# Patient Record
Sex: Female | Born: 1965 | Race: White | Hispanic: No | Marital: Married | Smoking: Former smoker
Health system: Southern US, Community
[De-identification: ages and names within clinical notes are randomized; demographics above are authoritative.]

## PROBLEM LIST (undated history)

## (undated) DIAGNOSIS — C7A090 Malignant carcinoid tumor of the bronchus and lung: Secondary | ICD-10-CM

## (undated) DIAGNOSIS — J189 Pneumonia, unspecified organism: Secondary | ICD-10-CM

## (undated) DIAGNOSIS — IMO0001 Reserved for inherently not codable concepts without codable children: Secondary | ICD-10-CM

## (undated) DIAGNOSIS — R059 Cough, unspecified: Secondary | ICD-10-CM

## (undated) DIAGNOSIS — Z86010 Personal history of colonic polyps: Secondary | ICD-10-CM

## (undated) DIAGNOSIS — D649 Anemia, unspecified: Secondary | ICD-10-CM

## (undated) DIAGNOSIS — Z87442 Personal history of urinary calculi: Secondary | ICD-10-CM

## (undated) DIAGNOSIS — R911 Solitary pulmonary nodule: Secondary | ICD-10-CM

## (undated) DIAGNOSIS — K219 Gastro-esophageal reflux disease without esophagitis: Secondary | ICD-10-CM

## (undated) DIAGNOSIS — J45909 Unspecified asthma, uncomplicated: Secondary | ICD-10-CM

## (undated) DIAGNOSIS — J309 Allergic rhinitis, unspecified: Secondary | ICD-10-CM

## (undated) DIAGNOSIS — R05 Cough: Secondary | ICD-10-CM

## (undated) DIAGNOSIS — J449 Chronic obstructive pulmonary disease, unspecified: Secondary | ICD-10-CM

## (undated) DIAGNOSIS — M1990 Unspecified osteoarthritis, unspecified site: Secondary | ICD-10-CM

## (undated) HISTORY — DX: Reserved for inherently not codable concepts without codable children: IMO0001

## (undated) HISTORY — DX: Unspecified osteoarthritis, unspecified site: M19.90

## (undated) HISTORY — DX: Chronic obstructive pulmonary disease, unspecified: J44.9

## (undated) HISTORY — PX: TUBAL LIGATION: SHX77

## (undated) HISTORY — DX: Gastro-esophageal reflux disease without esophagitis: K21.9

## (undated) HISTORY — DX: Cough, unspecified: R05.9

## (undated) HISTORY — DX: Unspecified asthma, uncomplicated: J45.909

## (undated) HISTORY — DX: Allergic rhinitis, unspecified: J30.9

## (undated) HISTORY — DX: Solitary pulmonary nodule: R91.1

## (undated) HISTORY — DX: Cough: R05

## (undated) HISTORY — PX: LITHOTRIPSY: SUR834

---

## 2013-02-20 ENCOUNTER — Encounter: Payer: Self-pay | Admitting: Critical Care Medicine

## 2013-02-21 ENCOUNTER — Ambulatory Visit (INDEPENDENT_AMBULATORY_CARE_PROVIDER_SITE_OTHER): Payer: BC Managed Care – PPO | Admitting: Critical Care Medicine

## 2013-02-21 ENCOUNTER — Encounter: Payer: Self-pay | Admitting: Critical Care Medicine

## 2013-02-21 VITALS — BP 124/84 | HR 67 | Temp 98.5°F | Ht 69.0 in | Wt 263.0 lb

## 2013-02-21 DIAGNOSIS — R911 Solitary pulmonary nodule: Secondary | ICD-10-CM

## 2013-02-21 DIAGNOSIS — R05 Cough: Secondary | ICD-10-CM

## 2013-02-21 DIAGNOSIS — J45991 Cough variant asthma: Secondary | ICD-10-CM | POA: Insufficient documentation

## 2013-02-21 DIAGNOSIS — R059 Cough, unspecified: Secondary | ICD-10-CM

## 2013-02-21 DIAGNOSIS — J45909 Unspecified asthma, uncomplicated: Secondary | ICD-10-CM

## 2013-02-21 MED ORDER — BENZONATATE 100 MG PO CAPS: ORAL_CAPSULE | ORAL | Status: DC

## 2013-02-21 MED ORDER — OMEPRAZOLE 20 MG PO CPDR: 20.0000 mg | DELAYED_RELEASE_CAPSULE | Freq: Every day | ORAL | Status: DC

## 2013-02-21 MED ORDER — HYDROCODONE-HOMATROPINE 5-1.5 MG PO TABS: 1.0000 | ORAL_TABLET | ORAL | Status: DC | PRN

## 2013-02-21 MED ORDER — CHLORPHENIRAMINE TANNATE 12 MG PO TABS: 1.0000 | ORAL_TABLET | Freq: Every day | ORAL | Status: DC

## 2013-02-21 NOTE — Progress Notes (Signed)
Subjective:    Patient ID: Anna Larson, female    DOB: 08/31/1966, 47 y.o.   MRN: [id number]  HPI Comments: Referral for lung mass and cough Chronic cough since 12/13.    Cough This is a new problem. The current episode started more than 1 month ago. The problem has been unchanged. The problem occurs hourly. The cough is productive of sputum (white sputum no blood). Associated symptoms include chest pain, headaches, heartburn, postnasal drip and wheezing. Pertinent negatives include no chills, ear congestion, ear pain, fever, hemoptysis, myalgias, nasal congestion, rash, rhinorrhea, sore throat, shortness of breath, sweats or weight loss. Associated symptoms comments: Chest pain is sharp pain at top of chest into the back if coughs, no change with deep breath Headaches with cough, bifrontal No dyspnea, wheezes come and go. The symptoms are aggravated by lying down and exercise (warm air causes cough, ok in cool air). Risk factors for lung disease include smoking/tobacco exposure. She has tried a beta-agonist inhaler, steroid inhaler and prescription cough suppressant (cough meds helped.  advair helps) for the symptoms. The treatment provided moderate relief. There is no history of asthma, bronchiectasis, COPD, emphysema, environmental allergies or pneumonia.   Past Medical History  Diagnosis Date  . Cough   . Lung mass   . Reactive airway disease      Family History  Problem Relation Age of Onset  . Bone cancer Maternal Grandmother   . Multiple sclerosis Mother   . Prostate cancer Paternal Grandfather      History   Social History  . Marital Status: Married    Spouse Name: N/A    Number of Children: N/A  . Years of Education: N/A   Occupational History  . [employer]    Social History Main Topics  . Smoking status: Never Smoker   . Smokeless tobacco: Never Used  . Alcohol Use: No  . Drug Use: Not on file  . Sexually Active: Not on file   Other Topics Concern  . Not on file    Social History Narrative  . No narrative on file     Allergies  Allergen Reactions  . Codeine      Outpatient Prescriptions Prior to Visit  Medication Sig Dispense Refill  . albuterol (PROVENTIL HFA;VENTOLIN HFA) 108 (90 BASE) MCG/ACT inhaler Inhale 2 puffs into the lungs every 6 (six) hours as needed for wheezing.      . Fluticasone-Salmeterol (ADVAIR) 250-50 MCG/DOSE AEPB Inhale 1 puff into the lungs every 12 (twelve) hours.      Marland Kitchen HYDROcodone-homatropine (HYCODAN) 5-1.5 MG/5ML syrup Take 5 mLs by mouth every 4 (four) hours as needed for cough.       No facility-administered medications prior to visit.       Review of Systems  Constitutional: Positive for diaphoresis. Negative for fever, chills, weight loss, activity change, appetite change, fatigue and unexpected weight change.  HENT: Positive for congestion and postnasal drip. Negative for hearing loss, ear pain, nosebleeds, sore throat, facial swelling, rhinorrhea, sneezing, mouth sores, trouble swallowing, neck pain, neck stiffness, dental problem, voice change, sinus pressure, tinnitus and ear discharge.   Eyes: Negative for photophobia, discharge, itching and visual disturbance.  Respiratory: Positive for cough, chest tightness and wheezing. Negative for apnea, hemoptysis, choking, shortness of breath and stridor.   Cardiovascular: Positive for chest pain and palpitations. Negative for leg swelling.  Gastrointestinal: Positive for heartburn. Negative for nausea, vomiting, abdominal pain, constipation, blood in stool and abdominal distention.  Genitourinary: Negative for  dysuria, urgency, frequency, hematuria, flank pain, decreased urine volume and difficulty urinating.  Musculoskeletal: Positive for back pain. Negative for myalgias, joint swelling, arthralgias and gait problem.  Skin: Negative for color change, pallor and rash.  Allergic/Immunologic: Negative for environmental allergies.  Neurological: Positive for  headaches. Negative for dizziness, tremors, seizures, syncope, speech difficulty, weakness, light-headedness and numbness.  Hematological: Negative for adenopathy. Does not bruise/bleed easily.  Psychiatric/Behavioral: Negative for confusion, sleep disturbance and agitation. The patient is not nervous/anxious.        Objective:   Physical Exam Filed Vitals:   02/21/13 1215  BP: 124/84  Pulse: 67  Temp: 98.5 F (36.9 C)  TempSrc: Oral  Height: 5\' 9"  (1.753 m)  Weight: 263 lb (119.296 kg)  SpO2: 98%    Gen: Pleasant, obese, in no distress,  normal affect  ENT: No lesions,  mouth clear,  oropharynx clear, ++postnasal drip  Neck: No JVD, no TMG, no carotid bruits  Lungs: No use of accessory muscles, no dullness to percussion, clear without rales or rhonchi  Cardiovascular: RRR, heart sounds normal, no murmur or gallops, no peripheral edema  Abdomen: soft and NT, no HSM,  BS normal  Musculoskeletal: No deformities, no cyanosis or clubbing  Neuro: alert, non focal  Skin: Warm, no lesions or rashes  No results found.        Assessment & Plan:

## 2013-02-21 NOTE — Patient Instructions (Addendum)
Follow cough protocol with hydrocodone and benzonatate Follow reflux diet Omeprazole one daily Stop advair Use albuterol only as needed We will follow up with another CT of chest in 3 months Return 6 weeks for cough follow up

## 2013-02-22 NOTE — Assessment & Plan Note (Signed)
RUL lobulated 1CM nodule, likely benign CT 02/2013 Plan F/u closely with repeat CT 7/ 2014

## 2013-02-22 NOTE — Assessment & Plan Note (Addendum)
Cyclical cough d/t upper airway instability and GERD. Doubt true asthma  Plan Follow cough protocol with hydrocodone and benzonatate Follow reflux diet Omeprazole one daily Stop advair Use albuterol only as needed We will follow up with another CT of chest in 3 months Return 6 weeks for cough follow up

## 2013-02-26 ENCOUNTER — Telehealth: Payer: Self-pay | Admitting: Critical Care Medicine

## 2013-02-26 NOTE — Telephone Encounter (Signed)
Dr Delford Field they no longer make the chlorpheniramine tennate 12 mg Do you want to have her to take the otc dose? Please advise thanks

## 2013-02-27 MED ORDER — CHLORPHENIRAMINE MALEATE 4 MG PO TABS: 12.0000 mg | ORAL_TABLET | Freq: Every day | ORAL | Status: DC

## 2013-02-27 NOTE — Telephone Encounter (Signed)
Pt has decidede that she would like to take 3 of the 4mg  tablet. This has been sent to her pharmacy.

## 2013-02-27 NOTE — Telephone Encounter (Signed)
Can try [place] or just get 4mg  and take 3

## 2013-04-04 ENCOUNTER — Ambulatory Visit (INDEPENDENT_AMBULATORY_CARE_PROVIDER_SITE_OTHER): Payer: BC Managed Care – PPO | Admitting: Critical Care Medicine

## 2013-04-04 ENCOUNTER — Encounter: Payer: Self-pay | Admitting: *Deleted

## 2013-04-04 ENCOUNTER — Encounter: Payer: Self-pay | Admitting: Critical Care Medicine

## 2013-04-04 VITALS — BP 122/84 | HR 86 | Temp 98.4°F | Ht 69.0 in | Wt 262.0 lb

## 2013-04-04 DIAGNOSIS — R05 Cough: Secondary | ICD-10-CM

## 2013-04-04 DIAGNOSIS — R059 Cough, unspecified: Secondary | ICD-10-CM

## 2013-04-04 MED ORDER — BENZONATATE 100 MG PO CAPS: ORAL_CAPSULE | ORAL | Status: DC

## 2013-04-04 MED ORDER — HYDROCODONE-HOMATROPINE 5-1.5 MG PO TABS: 1.0000 | ORAL_TABLET | ORAL | Status: DC | PRN

## 2013-04-04 NOTE — Assessment & Plan Note (Signed)
Cyclic cough without true asthma d/t GERD and postnasal drip Note spirometry normal 04/04/2013 Plan Cont cyclic cough protocol with hycodan pills and benzonatate Rx Gerd and antihistamine rx

## 2013-04-04 NOTE — Progress Notes (Signed)
Subjective:    Patient ID: Anna Larson, female    DOB: [birth year], 47 y.o.   MRN: [id number]  HPI 04/04/2013 At last ov we rx cyclical cough with  Follow cough protocol with hydrocodone and benzonatate  Follow reflux diet  Omeprazole one daily  Stop advair  Use albuterol only as needed  Cough did get better but now out side and cough more. No pndrip. Mucus is clear, Coughs all day long. If gets hot is worse.  Sore throat.   No heartburn, does burp . Foods :  Fish, steak, salad, baked potato.  No sodas. On PPI daily.  Benzonatate does not help.        Past Medical History  Diagnosis Date  . Cough   . Lung mass   . Reactive airway disease      Family History  Problem Relation Age of Onset  . Bone cancer Maternal Grandmother   . Multiple sclerosis Mother   . Prostate cancer Paternal Grandfather      History   Social History  . Marital Status: Married    Spouse Name: N/A    Number of Children: N/A  . Years of Education: N/A   Occupational History  . [employer]    Social History Main Topics  . Smoking status: Never Smoker   . Smokeless tobacco: Never Used  . Alcohol Use: No  . Drug Use: Not on file  . Sexually Active: Not on file   Other Topics Concern  . Not on file   Social History Narrative  . No narrative on file     Allergies  Allergen Reactions  . Codeine      Outpatient Prescriptions Prior to Visit  Medication Sig Dispense Refill  . albuterol (PROVENTIL HFA;VENTOLIN HFA) 108 (90 BASE) MCG/ACT inhaler Inhale 2 puffs into the lungs every 6 (six) hours as needed for wheezing.      . benzonatate (TESSALON) 100 MG capsule Use 1- 2 per cough protocol every 4 hours as needed  90 capsule  4  . chlorpheniramine (CHLOR-TRIMETON) 4 MG tablet Take 3 tablets (12 mg total) by mouth daily.  90 tablet  2  . Hydrocodone-Homatropine 5-1.5 MG TABS Take 1 tablet by mouth every 4 (four) hours as needed. Per cough protocol  60 each  1  . omeprazole (PRILOSEC) 20 MG  capsule Take 1 capsule (20 mg total) by mouth daily.  30 capsule  4  . Chlorpheniramine Tannate 12 MG TABS Take 1 tablet (12 mg total) by mouth at bedtime.  30 each  6   No facility-administered medications prior to visit.       Review of Systems  Constitutional: Positive for diaphoresis. Negative for activity change, appetite change, fatigue and unexpected weight change.  HENT: Positive for congestion. Negative for hearing loss, nosebleeds, facial swelling, sneezing, mouth sores, trouble swallowing, neck pain, neck stiffness, dental problem, voice change, sinus pressure, tinnitus and ear discharge.   Eyes: Negative for photophobia, discharge, itching and visual disturbance.  Respiratory: Positive for chest tightness. Negative for apnea, choking and stridor.   Cardiovascular: Positive for palpitations. Negative for leg swelling.  Gastrointestinal: Negative for nausea, vomiting, abdominal pain, constipation, blood in stool and abdominal distention.  Genitourinary: Negative for dysuria, urgency, frequency, hematuria, flank pain, decreased urine volume and difficulty urinating.  Musculoskeletal: Positive for back pain. Negative for joint swelling, arthralgias and gait problem.  Skin: Negative for color change and pallor.  Neurological: Negative for dizziness, tremors, seizures, syncope, speech difficulty,  weakness, light-headedness and numbness.  Hematological: Negative for adenopathy. Does not bruise/bleed easily.  Psychiatric/Behavioral: Negative for confusion, sleep disturbance and agitation. The patient is not nervous/anxious.        Objective:   Physical Exam  Filed Vitals:   04/04/13 0909  BP: 122/84  Pulse: 86  Temp: 98.4 F (36.9 C)  TempSrc: Oral  Height: 5\' 9"  (1.753 m)  Weight: 262 lb (118.842 kg)  SpO2: 96%    Gen: Pleasant, obese, in no distress,  normal affect  ENT: No lesions,  mouth clear,  oropharynx clear, ++postnasal drip  Neck: No JVD, no TMG, no carotid  bruits  Lungs: No use of accessory muscles, no dullness to percussion, clear without rales or rhonchi  Cardiovascular: RRR, heart sounds normal, no murmur or gallops, no peripheral edema  Abdomen: soft and NT, no HSM,  BS normal  Musculoskeletal: No deformities, no cyanosis or clubbing  Neuro: alert, non focal  Skin: Warm, no lesions or rashes  No results found.        Assessment & Plan:   Cough Cyclic cough without true asthma d/t GERD and postnasal drip Note spirometry normal 04/04/2013 Plan Cont cyclic cough protocol with hycodan pills and benzonatate Rx Genella Rife and antihistamine rx   Updated Medication List Outpatient Encounter Prescriptions as of 04/04/2013  Medication Sig Dispense Refill  . albuterol (PROVENTIL HFA;VENTOLIN HFA) 108 (90 BASE) MCG/ACT inhaler Inhale 2 puffs into the lungs every 6 (six) hours as needed for wheezing.      . benzonatate (TESSALON) 100 MG capsule Use 1- 2 per cough protocol every 4 hours as needed  90 capsule  4  . chlorpheniramine (CHLOR-TRIMETON) 4 MG tablet Take 3 tablets (12 mg total) by mouth daily.  90 tablet  2  . diclofenac (VOLTAREN) 75 MG EC tablet Take 1 tablet by mouth 2 (two) times daily.      . Hydrocodone-Homatropine 5-1.5 MG TABS Take 1 tablet by mouth every 4 (four) hours as needed. Per cough protocol  60 each  1  . omeprazole (PRILOSEC) 20 MG capsule Take 1 capsule (20 mg total) by mouth daily.  30 capsule  4  . [DISCONTINUED] benzonatate (TESSALON) 100 MG capsule Use 1- 2 per cough protocol every 4 hours as needed  90 capsule  4  . [DISCONTINUED] Hydrocodone-Homatropine 5-1.5 MG TABS Take 1 tablet by mouth every 4 (four) hours as needed. Per cough protocol  60 each  1  . [DISCONTINUED] Chlorpheniramine Tannate 12 MG TABS Take 1 tablet (12 mg total) by mouth at bedtime.  30 each  6   No facility-administered encounter medications on file as of 04/04/2013.

## 2013-04-04 NOTE — Patient Instructions (Addendum)
REpeat cyclic cough protocol again for 3 days using hydrocodone/ benzonatate No other medication changes Return 3 months

## 2013-05-23 ENCOUNTER — Ambulatory Visit (HOSPITAL_BASED_OUTPATIENT_CLINIC_OR_DEPARTMENT_OTHER)
Admission: RE | Admit: 2013-05-23 | Discharge: 2013-05-23 | Disposition: A | Discharge status: Home / Self Care | Payer: BC Managed Care – PPO | Source: Ambulatory Visit | Attending: Critical Care Medicine | Admitting: Critical Care Medicine

## 2013-05-23 DIAGNOSIS — J45909 Unspecified asthma, uncomplicated: Secondary | ICD-10-CM

## 2013-05-23 DIAGNOSIS — R911 Solitary pulmonary nodule: Secondary | ICD-10-CM | POA: Insufficient documentation

## 2013-05-27 ENCOUNTER — Telehealth: Payer: Self-pay | Admitting: Critical Care Medicine

## 2013-05-27 NOTE — Telephone Encounter (Signed)
Calling back again about the same.

## 2013-05-27 NOTE — Progress Notes (Signed)
Quick Note:  [email or web address] for pt ______ 

## 2013-05-27 NOTE — Telephone Encounter (Signed)
Notes Recorded by [name], MD on 05/24/2013 at 1:19 PM Call pt and tell her RUL nodule is stable. A repeat CT should be done in 6 months  -----  [email or web address]

## 2013-05-28 ENCOUNTER — Telehealth: Payer: Self-pay | Admitting: Critical Care Medicine

## 2013-05-28 NOTE — Telephone Encounter (Signed)
Patient calling again about results.  [phone]

## 2013-05-28 NOTE — Telephone Encounter (Signed)
Notes Recorded by [name], MD on 05/24/2013 at 1:19 PM Call pt and tell her RUL nodule is stable. A repeat CT should be done in 6 months  Pt aware of results.

## 2013-05-28 NOTE — Telephone Encounter (Signed)
atc

## 2013-05-28 NOTE — Telephone Encounter (Signed)
LMTCB

## 2013-05-29 NOTE — Progress Notes (Signed)
Quick Note:  Per phone msg from 05/28/13, pt aware of results. PW has a reminder if for repeat CT in 6 months. ______

## 2013-07-04 ENCOUNTER — Ambulatory Visit: Payer: BC Managed Care – PPO | Admitting: Critical Care Medicine

## 2013-07-11 ENCOUNTER — Ambulatory Visit (INDEPENDENT_AMBULATORY_CARE_PROVIDER_SITE_OTHER): Payer: BC Managed Care – PPO | Admitting: Critical Care Medicine

## 2013-07-11 ENCOUNTER — Encounter: Payer: Self-pay | Admitting: Critical Care Medicine

## 2013-07-11 VITALS — BP 122/72 | HR 90 | Ht 69.0 in | Wt 268.0 lb

## 2013-07-11 DIAGNOSIS — R911 Solitary pulmonary nodule: Secondary | ICD-10-CM

## 2013-07-11 DIAGNOSIS — R05 Cough: Secondary | ICD-10-CM

## 2013-07-11 DIAGNOSIS — R059 Cough, unspecified: Secondary | ICD-10-CM

## 2013-07-11 MED ORDER — HYDROCODONE-HOMATROPINE 5-1.5 MG PO TABS: 1.0000 | ORAL_TABLET | ORAL | Status: DC | PRN

## 2013-07-11 NOTE — Assessment & Plan Note (Signed)
Cyclical cough without true asthma due to reflux disease and postnasal drip well controlled at this time Plan Maintain chlorpheniramine and reflux medication Hycodan cough pills were refilled Return as needed

## 2013-07-11 NOTE — Progress Notes (Signed)
Subjective:    Patient ID: Anna Larson, female    DOB: 04/12/1966, 47 y.o.   MRN: [id number]  HPI  07/11/2013 Chief Complaint  Patient presents with  . 3 month follow up    Cough has improved but does still cough when in the heat.  Cough is prod at time with clear mucus.  Sore throat today with PND.     Since the last visit 1 year ago the patient's cough has nearly resolved. The patient maintains chlorpheniramine and omeprazole. She intermittently is following the reflux diet. There are no other new complaints  Past Medical History  Diagnosis Date  . Cough   . Lung mass   . Reactive airway disease      Family History  Problem Relation Age of Onset  . Bone cancer Maternal Grandmother   . Multiple sclerosis Mother   . Prostate cancer Paternal Grandfather      History   Social History  . Marital Status: Married    Spouse Name: N/A    Number of Children: N/A  . Years of Education: N/A   Occupational History  . [employer]    Social History Main Topics  . Smoking status: Never Smoker   . Smokeless tobacco: Never Used  . Alcohol Use: No  . Drug Use: Not on file  . Sexual Activity: Not on file   Other Topics Concern  . Not on file   Social History Narrative  . No narrative on file     Allergies  Allergen Reactions  . Codeine      Outpatient Prescriptions Prior to Visit  Medication Sig Dispense Refill  . albuterol (PROVENTIL HFA;VENTOLIN HFA) 108 (90 BASE) MCG/ACT inhaler Inhale 2 puffs into the lungs every 6 (six) hours as needed for wheezing.      . chlorpheniramine (CHLOR-TRIMETON) 4 MG tablet Take 3 tablets (12 mg total) by mouth daily.  90 tablet  2  . diclofenac (VOLTAREN) 75 MG EC tablet Take 1 tablet by mouth 2 (two) times daily.      Marland Kitchen omeprazole (PRILOSEC) 20 MG capsule Take 1 capsule (20 mg total) by mouth daily.  30 capsule  4  . Hydrocodone-Homatropine 5-1.5 MG TABS Take 1 tablet by mouth every 4 (four) hours as needed. Per cough protocol  60 each  1  .  benzonatate (TESSALON) 100 MG capsule Use 1- 2 per cough protocol every 4 hours as needed  90 capsule  4   No facility-administered medications prior to visit.       Review of Systems  Constitutional: Positive for diaphoresis. Negative for activity change, appetite change, fatigue and unexpected weight change.  HENT: Positive for congestion. Negative for hearing loss, nosebleeds, facial swelling, sneezing, mouth sores, trouble swallowing, neck pain, neck stiffness, dental problem, voice change, sinus pressure, tinnitus and ear discharge.   Eyes: Negative for photophobia, discharge, itching and visual disturbance.  Respiratory: Positive for chest tightness. Negative for apnea, choking and stridor.   Cardiovascular: Positive for palpitations. Negative for leg swelling.  Gastrointestinal: Negative for nausea, vomiting, abdominal pain, constipation, blood in stool and abdominal distention.  Genitourinary: Negative for dysuria, urgency, frequency, hematuria, flank pain, decreased urine volume and difficulty urinating.  Musculoskeletal: Positive for back pain. Negative for joint swelling, arthralgias and gait problem.  Skin: Negative for color change and pallor.  Neurological: Negative for dizziness, tremors, seizures, syncope, speech difficulty, weakness, light-headedness and numbness.  Hematological: Negative for adenopathy. Does not bruise/bleed easily.  Psychiatric/Behavioral: Negative for  confusion, sleep disturbance and agitation. The patient is not nervous/anxious.        Objective:   Physical Exam  Filed Vitals:   07/11/13 1436  BP: 122/72  Pulse: 90  Height: 5\' 9"  (1.753 m)  Weight: 268 lb (121.564 kg)  SpO2: 98%    Gen: Pleasant, obese, in no distress,  normal affect  ENT: No lesions,  mouth clear,  oropharynx clear, ++postnasal drip  Neck: No JVD, no TMG, no carotid bruits  Lungs: No use of accessory muscles, no dullness to percussion, clear without rales or  rhonchi  Cardiovascular: RRR, heart sounds normal, no murmur or gallops, no peripheral edema  Abdomen: soft and NT, no HSM,  BS normal  Musculoskeletal: No deformities, no cyanosis or clubbing  Neuro: alert, non focal  Skin: Warm, no lesions or rashes  No results found.  CT chest 05/2013: unchanged RUL lung nodule, benign      Assessment & Plan:   Cough Cyclical cough without true asthma due to reflux disease and postnasal drip well controlled at this time Plan Maintain chlorpheniramine and reflux medication Hycodan cough pills were refilled Return as needed   RUL lung nodule, likely benign, no change over time    Updated Medication List Outpatient Encounter Prescriptions as of 07/11/2013  Medication Sig Dispense Refill  . albuterol (PROVENTIL HFA;VENTOLIN HFA) 108 (90 BASE) MCG/ACT inhaler Inhale 2 puffs into the lungs every 6 (six) hours as needed for wheezing.      . chlorpheniramine (CHLOR-TRIMETON) 4 MG tablet Take 3 tablets (12 mg total) by mouth daily.  90 tablet  2  . diclofenac (VOLTAREN) 75 MG EC tablet Take 1 tablet by mouth 2 (two) times daily.      . Hydrocodone-Homatropine 5-1.5 MG TABS Take 1 tablet by mouth every 4 (four) hours as needed. Per cough protocol  60 each  1  . omeprazole (PRILOSEC) 20 MG capsule Take 1 capsule (20 mg total) by mouth daily.  30 capsule  4  . [DISCONTINUED] Hydrocodone-Homatropine 5-1.5 MG TABS Take 1 tablet by mouth every 4 (four) hours as needed. Per cough protocol  60 each  1  . [DISCONTINUED] benzonatate (TESSALON) 100 MG capsule Use 1- 2 per cough protocol every 4 hours as needed  90 capsule  4   No facility-administered encounter medications on file as of 07/11/2013.

## 2013-07-11 NOTE — Patient Instructions (Addendum)
Hycodan cough pills reordered A Flu vaccine was given Stay on chlorpheniramine and omeprazole Stay on reflux diet Return to pulmonary as needed

## 2013-09-23 ENCOUNTER — Ambulatory Visit (INDEPENDENT_AMBULATORY_CARE_PROVIDER_SITE_OTHER): Payer: BC Managed Care – PPO | Admitting: Critical Care Medicine

## 2013-09-23 ENCOUNTER — Encounter: Payer: Self-pay | Admitting: Critical Care Medicine

## 2013-09-23 VITALS — BP 122/78 | HR 73 | Temp 98.3°F | Ht 69.0 in | Wt 265.0 lb

## 2013-09-23 DIAGNOSIS — J45991 Cough variant asthma: Secondary | ICD-10-CM

## 2013-09-23 DIAGNOSIS — R059 Cough, unspecified: Secondary | ICD-10-CM

## 2013-09-23 DIAGNOSIS — J309 Allergic rhinitis, unspecified: Secondary | ICD-10-CM

## 2013-09-23 DIAGNOSIS — R05 Cough: Secondary | ICD-10-CM

## 2013-09-23 DIAGNOSIS — Z23 Encounter for immunization: Secondary | ICD-10-CM

## 2013-09-23 MED ORDER — CICLESONIDE 80 MCG/ACT IN AERS: 2.0000 | INHALATION_SPRAY | Freq: Every day | RESPIRATORY_TRACT | Status: DC

## 2013-09-23 MED ORDER — BENZONATATE 100 MG PO CAPS: ORAL_CAPSULE | ORAL | Status: DC

## 2013-09-23 MED ORDER — OMEPRAZOLE 20 MG PO CPDR: 20.0000 mg | DELAYED_RELEASE_CAPSULE | Freq: Two times a day (BID) | ORAL | Status: DC

## 2013-09-23 NOTE — Patient Instructions (Addendum)
Try [place] two puff daily, use samples Start Benzonatate 1-2 every 4 hours as needed for cough, and minimize the use of the hycodan tablets Increase omeprazole one twice daily before meals Follow reflux diet Strict Allergy blood test today Pneumonia vaccine today was given Return 2 months

## 2013-09-23 NOTE — Assessment & Plan Note (Signed)
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

## 2013-09-23 NOTE — Progress Notes (Signed)
Subjective:    Patient ID: Anna Larson, female    DOB: May 31, 1966, 47 y.o.   MRN: [id number]  HPI Since the last visit 1 year ago the patient's cough has nearly resolved. The patient maintains chlorpheniramine and omeprazole. She intermittently is following the reflux diet. There are no other new complaints   09/23/2013 Chief Complaint  Patient presents with  . Follow-up    Still has cough - prod at times with clear mucus.  SOB when climbing stairs.  Also, has sharp right side chest pain that radiates through back with nausea - started over the weekend.    Still coughing.  Not as bad as before.  If gets hot, takes a cough pill before goes to work.  Engineer, petroleum.  Cough is occ prod of phlegm, clear.  Pt notes some dyspnea with exertion, if goes down steps and up steps is dyspneic. Occ wheeze is noted. No pndrip.  Notes anterior chest pain through the back.  Sharp pain, may be worse with deep breath.  No heavy lifting.  No heartburn. No belch or burping. No sore throat. Pt is hoarse in am.  Occ cough qhs if gets hot Uses cough pill on ave>>one a day. Past Medical History  Diagnosis Date  . Cough      Family History  Problem Relation Age of Onset  . Bone cancer Maternal Grandmother   . Multiple sclerosis Mother   . Prostate cancer Paternal Grandfather      History   Social History  . Marital Status: Married    Spouse Name: N/A    Number of Children: N/A  . Years of Education: N/A   Occupational History  . [employer]    Social History Main Topics  . Smoking status: Never Smoker   . Smokeless tobacco: Never Used  . Alcohol Use: No  . Drug Use: Not on file  . Sexual Activity: Not on file   Other Topics Concern  . Not on file   Social History Narrative  . No narrative on file     Allergies  Allergen Reactions  . Codeine      Outpatient Prescriptions Prior to Visit  Medication Sig Dispense Refill  . albuterol (PROVENTIL HFA;VENTOLIN HFA) 108 (90 BASE) MCG/ACT  inhaler Inhale 2 puffs into the lungs every 6 (six) hours as needed for wheezing.      . chlorpheniramine (CHLOR-TRIMETON) 4 MG tablet Take 3 tablets (12 mg total) by mouth daily.  90 tablet  2  . diclofenac (VOLTAREN) 75 MG EC tablet Take 1 tablet by mouth 2 (two) times daily as needed.       . Hydrocodone-Homatropine 5-1.5 MG TABS Take 1 tablet by mouth every 4 (four) hours as needed. Per cough protocol  60 each  1  . omeprazole (PRILOSEC) 20 MG capsule Take 1 capsule (20 mg total) by mouth daily.  30 capsule  4   No facility-administered medications prior to visit.       Review of Systems  Constitutional: Positive for diaphoresis. Negative for activity change, appetite change, fatigue and unexpected weight change.  HENT: Positive for congestion. Negative for dental problem, ear discharge, facial swelling, hearing loss, mouth sores, nosebleeds, sinus pressure, sneezing, tinnitus, trouble swallowing and voice change.   Eyes: Negative for photophobia, discharge, itching and visual disturbance.  Respiratory: Positive for chest tightness. Negative for apnea, choking and stridor.   Cardiovascular: Positive for palpitations. Negative for leg swelling.  Gastrointestinal: Negative for nausea, vomiting, abdominal pain,  constipation, blood in stool and abdominal distention.  Genitourinary: Negative for dysuria, urgency, frequency, hematuria, flank pain, decreased urine volume and difficulty urinating.  Musculoskeletal: Positive for back pain. Negative for arthralgias, gait problem, joint swelling, neck pain and neck stiffness.  Skin: Negative for color change and pallor.  Neurological: Negative for dizziness, tremors, seizures, syncope, speech difficulty, weakness, light-headedness and numbness.  Hematological: Negative for adenopathy. Does not bruise/bleed easily.  Psychiatric/Behavioral: Negative for confusion, sleep disturbance and agitation. The patient is not nervous/anxious.         Objective:   Physical Exam  Filed Vitals:   09/23/13 0946  BP: 122/78  Pulse: 73  Temp: 98.3 F (36.8 C)  TempSrc: Oral  Height: 5\' 9"  (1.753 m)  Weight: 265 lb (120.203 kg)  SpO2: 100%    Gen: Pleasant, obese, in no distress,  normal affect  ENT: No lesions,  mouth clear,  oropharynx clear, ++postnasal drip  Neck: No JVD, no TMG, no carotid bruits  Lungs: No use of accessory muscles, no dullness to percussion, clear without rales or rhonchi  Cardiovascular: RRR, heart sounds normal, no murmur or gallops, no peripheral edema  Abdomen: soft and NT, no HSM,  BS normal  Musculoskeletal: No deformities, no cyanosis or clubbing  Neuro: alert, non focal  Skin: Warm, no lesions or rashes  No results found.  Spirometry 09/23/2013 normal      Assessment & Plan:   Cough variant asthma Cyclical cough with associated cough variant asthma due to ongoing reflux disease and postnasal drip from allergic rhinitis Note previous spirometry showed restrictive defect only and this is confirmed on current study of 09/23/2013  Plan Try Alvesco two puff daily, use samples Start Benzonatate 1-2 every 4 hours as needed for cough, and minimize the use of the hycodan tablets Increase omeprazole one twice daily before meals Follow reflux diet Strict Allergy blood test today Pneumonia vaccine today was given Return 2 months    RUL lung nodule, likely benign, no change over time    Updated Medication List Outpatient Encounter Prescriptions as of 09/23/2013  Medication Sig  . albuterol (PROVENTIL HFA;VENTOLIN HFA) 108 (90 BASE) MCG/ACT inhaler Inhale 2 puffs into the lungs every 6 (six) hours as needed for wheezing.  . chlorpheniramine (CHLOR-TRIMETON) 4 MG tablet Take 3 tablets (12 mg total) by mouth daily.  . diclofenac (VOLTAREN) 75 MG EC tablet Take 1 tablet by mouth 2 (two) times daily as needed.   . Hydrocodone-Homatropine 5-1.5 MG TABS Take 1 tablet by mouth every 4 (four)  hours as needed. Per cough protocol  . omeprazole (PRILOSEC) 20 MG capsule Take 1 capsule (20 mg total) by mouth 2 (two) times daily before a meal.  . [DISCONTINUED] omeprazole (PRILOSEC) 20 MG capsule Take 1 capsule (20 mg total) by mouth daily.  . benzonatate (TESSALON) 100 MG capsule Take 1-2 every 4 hours as needed for cough  . ciclesonide (ALVESCO) 80 MCG/ACT inhaler Inhale 2 puffs into the lungs daily.

## 2013-09-24 LAB — ALLERGY FULL PROFILE
Allergen, D pternoyssinus,d7: <0.1 kU/L
Allergen,Goose feathers, e70: <0.1 kU/L
Aspergillus fumigatus, m3: <0.1 kU/L
Bermuda Grass: <0.1 kU/L
Box Elder IgE: <0.1 kU/L
Candida Albicans: <0.1 kU/L
Cat Dander: <0.1 kU/L
Curvularia lunata: <0.1 kU/L
G005 Rye, Perennial: <0.1 kU/L
G009 Red Top: <0.1 kU/L
Helminthosporium halodes: <0.1 kU/L
House Dust Hollister: <0.1 kU/L
IgE (Immunoglobulin E), Serum: 2.1 IU/mL (ref 0.0–180.0)
Oak: <0.1 kU/L
Plantain: <0.1 kU/L
Stemphylium Botryosum: <0.1 kU/L
Timothy Grass: <0.1 kU/L

## 2013-09-25 NOTE — Progress Notes (Signed)
Quick Note:  Called pt, went directly to VM. lmomtcb for pt ______

## 2013-09-25 NOTE — Progress Notes (Signed)
Quick Note:  Call pt and tell her labs are ok, No change in medications She has NO positive allergies seen ______

## 2013-09-25 NOTE — Progress Notes (Signed)
Quick Note:  Patient returned call. Advised of lab results / recs as stated by PW. Pt verbalized understanding and denied any questions. ______

## 2013-11-22 ENCOUNTER — Telehealth: Payer: Self-pay | Admitting: *Deleted

## 2013-11-22 DIAGNOSIS — R911 Solitary pulmonary nodule: Secondary | ICD-10-CM

## 2013-11-22 NOTE — Telephone Encounter (Signed)
Message copied by Adalberto Ill on Fri Nov 22, 2013  9:16 AM ------      Message from: Elsie Stain      Created: Fri Nov 22, 2013  8:58 AM       Need to schedule ct chest non contrast to f/u on lung nodule            ----- Message -----         From: Elsie Stain, MD         Sent: 05/23/2013  11:48 AM           To: Elsie Stain, MD            Recheck CT chest nodule       ------

## 2013-11-22 NOTE — Telephone Encounter (Signed)
Order placed - lmomtcb for pt to inform her

## 2013-11-22 NOTE — Telephone Encounter (Signed)
Returning call can be reached at [phone].Elnita Maxwell

## 2013-11-22 NOTE — Telephone Encounter (Signed)
lmomtcb x1 

## 2013-11-22 NOTE — Telephone Encounter (Signed)
Called, spoke with pt.  She is aware it is time to have CT Chest done.  She is aware PCCs will call regarding the appt date and time for this.  She verbalized understanding and is aware of her pending OV with PW on Jan 27.

## 2013-11-26 ENCOUNTER — Ambulatory Visit: Payer: BC Managed Care – PPO | Admitting: Critical Care Medicine

## 2013-11-27 ENCOUNTER — Inpatient Hospital Stay: Admission: RE | Admit: 2013-11-27 | Payer: BC Managed Care – PPO | Source: Ambulatory Visit

## 2013-12-02 ENCOUNTER — Ambulatory Visit (INDEPENDENT_AMBULATORY_CARE_PROVIDER_SITE_OTHER)
Admission: RE | Admit: 2013-12-02 | Discharge: 2013-12-02 | Disposition: A | Discharge status: Home / Self Care | Payer: 59 | Source: Ambulatory Visit | Attending: Critical Care Medicine | Admitting: Critical Care Medicine

## 2013-12-02 ENCOUNTER — Encounter: Payer: Self-pay | Admitting: Critical Care Medicine

## 2013-12-02 DIAGNOSIS — R911 Solitary pulmonary nodule: Secondary | ICD-10-CM

## 2013-12-03 ENCOUNTER — Ambulatory Visit (INDEPENDENT_AMBULATORY_CARE_PROVIDER_SITE_OTHER): Payer: 59 | Admitting: Critical Care Medicine

## 2013-12-03 ENCOUNTER — Encounter: Payer: Self-pay | Admitting: Critical Care Medicine

## 2013-12-03 VITALS — BP 128/80 | HR 87 | Temp 98.3°F | Ht 67.0 in | Wt 263.0 lb

## 2013-12-03 DIAGNOSIS — J45991 Cough variant asthma: Secondary | ICD-10-CM

## 2013-12-03 DIAGNOSIS — R911 Solitary pulmonary nodule: Secondary | ICD-10-CM

## 2013-12-03 MED ORDER — HYDROCODONE-HOMATROPINE 5-1.5 MG PO TABS
1.0000 | ORAL_TABLET | ORAL | Status: DC | PRN
Start: 2013-12-03 — End: 2015-08-26

## 2013-12-03 NOTE — Patient Instructions (Signed)
Use hycodan as needed Use pepcid 20mg  or zantac 150mg  at bedtime Over the counter Stay on omeprazole twice daily Stop Alvesco Return 4 months

## 2013-12-03 NOTE — Progress Notes (Signed)
Subjective:    Patient ID: Anna Larson, female    DOB: [birth year], 48 y.o.   MRN: [id number]  HPI Since the last visit 1 year ago the patient's cough has nearly resolved. The patient maintains chlorpheniramine and omeprazole. She intermittently is following the reflux diet. There are no other new complaints  12/03/2013 Chief Complaint  Patient presents with  . 2 month follow up    Coughing unchnaged - losses breath and has wheezing during coughing spells at times.  Cough is prod at times with clear mucus.   Cyclical cough with associated cough variant asthma due to ongoing reflux disease and postnasal drip from allergic rhinitis Note previous spirometry showed restrictive defect only and this is confirmed on current study of 09/23/2013  Plan Try Alvesco two puff daily, use samples Start Benzonatate 1-2 every 4 hours as needed for cough, and minimize the use of the hycodan tablets Increase omeprazole one twice daily before meals Follow reflux diet Strict Allergy blood test today  No change to cough , coughs till has emesis.  Nodule unchanged on CT  Cough occ during meals.  If gets hot will cough severly. No real wheeze.     Past Medical History  Diagnosis Date  . Cough      Family History  Problem Relation Age of Onset  . Bone cancer Maternal Grandmother   . Multiple sclerosis Mother   . Prostate cancer Paternal Grandfather      History   Social History  . Marital Status: Married    Spouse Name: N/A    Number of Children: N/A  . Years of Education: N/A   Occupational History  . [employer]    Social History Main Topics  . Smoking status: Never Smoker   . Smokeless tobacco: Never Used  . Alcohol Use: No  . Drug Use: Not on file  . Sexual Activity: Not on file   Other Topics Concern  . Not on file   Social History Narrative  . No narrative on file     Allergies  Allergen Reactions  . Codeine      Outpatient Prescriptions Prior to Visit  Medication Sig  Dispense Refill  . albuterol (PROVENTIL HFA;VENTOLIN HFA) 108 (90 BASE) MCG/ACT inhaler Inhale 2 puffs into the lungs every 6 (six) hours as needed for wheezing.      . benzonatate (TESSALON) 100 MG capsule Take 1-2 every 4 hours as needed for cough  90 capsule  4  . chlorpheniramine (CHLOR-TRIMETON) 4 MG tablet Take 3 tablets (12 mg total) by mouth daily.  90 tablet  2  . diclofenac (VOLTAREN) 75 MG EC tablet Take 1 tablet by mouth 2 (two) times daily as needed.       Marland Kitchen omeprazole (PRILOSEC) 20 MG capsule Take 1 capsule (20 mg total) by mouth 2 (two) times daily before a meal.  60 capsule  6  . ciclesonide (ALVESCO) 80 MCG/ACT inhaler Inhale 2 puffs into the lungs daily.  1 Inhaler  3  . Hydrocodone-Homatropine 5-1.5 MG TABS Take 1 tablet by mouth every 4 (four) hours as needed. Per cough protocol  60 each  1   No facility-administered medications prior to visit.       Review of Systems  Constitutional: Positive for diaphoresis. Negative for activity change, appetite change, fatigue and unexpected weight change.  HENT: Positive for congestion. Negative for dental problem, ear discharge, facial swelling, hearing loss, mouth sores, nosebleeds, sinus pressure, sneezing, tinnitus, trouble swallowing and voice  change.   Eyes: Negative for photophobia, discharge, itching and visual disturbance.  Respiratory: Positive for chest tightness. Negative for apnea, choking and stridor.   Cardiovascular: Positive for palpitations. Negative for leg swelling.  Gastrointestinal: Negative for nausea, vomiting, abdominal pain, constipation, blood in stool and abdominal distention.  Genitourinary: Negative for dysuria, urgency, frequency, hematuria, flank pain, decreased urine volume and difficulty urinating.  Musculoskeletal: Positive for back pain. Negative for arthralgias, gait problem, joint swelling, neck pain and neck stiffness.  Skin: Negative for color change and pallor.  Neurological: Negative for  dizziness, tremors, seizures, syncope, speech difficulty, weakness, light-headedness and numbness.  Hematological: Negative for adenopathy. Does not bruise/bleed easily.  Psychiatric/Behavioral: Negative for confusion, sleep disturbance and agitation. The patient is not nervous/anxious.        Objective:   Physical Exam  Filed Vitals:   12/03/13 1440  BP: 128/80  Pulse: 87  Temp: 98.3 F (36.8 C)  TempSrc: Oral  Height: 5\' 7"  (1.702 m)  Weight: 263 lb (119.296 kg)  SpO2: 100%    Gen: Pleasant, obese, in no distress,  normal affect  ENT: No lesions,  mouth clear,  oropharynx clear, ++postnasal drip  Neck: No JVD, no TMG, no carotid bruits  Lungs: No use of accessory muscles, no dullness to percussion, clear without rales or rhonchi  Cardiovascular: RRR, heart sounds normal, no murmur or gallops, no peripheral edema  Abdomen: soft and NT, no HSM,  BS normal  Musculoskeletal: No deformities, no cyanosis or clubbing  Neuro: alert, non focal  Skin: Warm, no lesions or rashes  No results found.  Spirometry 09/23/2013 normal      Assessment & Plan:   Cough variant asthma Cough variant asthma without improvement using inhaled steroid and exacerbation of cough with the use of the inhaled steroid Reflux playing a significant role along with allergic rhinitis and postnasal drip Plan Use hycodan as needed Use pepcid 20mg  or zantac 150mg  at bedtime Over the counter Stay on omeprazole twice daily Stop Alvesco Return 4 months   Lung nodule seen on imaging study Right upper lobe lobulated 1 cm nodule with repeat CT scanning on 12/02/2013 showing unchanged nodule will repeat CT scan towards the end of 2016    RUL lung nodule, likely benign, no change over time    Updated Medication List Outpatient Encounter Prescriptions as of 12/03/2013  Medication Sig  . albuterol (PROVENTIL HFA;VENTOLIN HFA) 108 (90 BASE) MCG/ACT inhaler Inhale 2 puffs into the lungs every 6  (six) hours as needed for wheezing.  . benzonatate (TESSALON) 100 MG capsule Take 1-2 every 4 hours as needed for cough  . chlorpheniramine (CHLOR-TRIMETON) 4 MG tablet Take 3 tablets (12 mg total) by mouth daily.  . diclofenac (VOLTAREN) 75 MG EC tablet Take 1 tablet by mouth 2 (two) times daily as needed.   . Hydrocodone-Homatropine 5-1.5 MG TABS Take 1 tablet by mouth every 4 (four) hours as needed. Per cough protocol  . omeprazole (PRILOSEC) 20 MG capsule Take 1 capsule (20 mg total) by mouth 2 (two) times daily before a meal.  . [DISCONTINUED] ciclesonide (ALVESCO) 80 MCG/ACT inhaler Inhale 2 puffs into the lungs daily.  . [DISCONTINUED] Hydrocodone-Homatropine 5-1.5 MG TABS Take 1 tablet by mouth every 4 (four) hours as needed. Per cough protocol

## 2013-12-05 NOTE — Assessment & Plan Note (Signed)
Right upper lobe lobulated 1 cm nodule with repeat CT scanning on 12/02/2013 showing unchanged nodule will repeat CT scan towards the end of 2016

## 2013-12-05 NOTE — Assessment & Plan Note (Signed)
Cough variant asthma without improvement using inhaled steroid and exacerbation of cough with the use of the inhaled steroid Reflux playing a significant role along with allergic rhinitis and postnasal drip Plan Use hycodan as needed Use pepcid 20mg  or zantac 150mg  at bedtime Over the counter Stay on omeprazole twice daily Stop Alvesco Return 4 months

## 2015-01-15 ENCOUNTER — Ambulatory Visit: Payer: Self-pay | Admitting: Critical Care Medicine

## 2015-07-08 ENCOUNTER — Telehealth: Payer: Self-pay | Admitting: Critical Care Medicine

## 2015-07-08 DIAGNOSIS — R911 Solitary pulmonary nodule: Secondary | ICD-10-CM

## 2015-07-08 NOTE — Telephone Encounter (Signed)
Pt is due for CT Chest follow up in Sept 2016.   Order placed.  Pt aware PCCs will call her to schedule.   Pt verbalized understanding and voiced no further questions or concerns at this time. PCCs, please advise.  Thank you.

## 2015-07-08 NOTE — Telephone Encounter (Signed)
I scheduled pt's CT for 9/15. I have called & left her vm to call me back so I can give her the info.

## 2015-07-09 NOTE — Telephone Encounter (Signed)
Pt called back she has [employer] ins it has been precerted pt is aware [name]

## 2015-07-14 ENCOUNTER — Ambulatory Visit: Payer: Self-pay | Admitting: Critical Care Medicine

## 2015-07-16 ENCOUNTER — Ambulatory Visit: Payer: Self-pay | Admitting: Adult Health

## 2015-07-23 ENCOUNTER — Ambulatory Visit (INDEPENDENT_AMBULATORY_CARE_PROVIDER_SITE_OTHER)
Admission: RE | Admit: 2015-07-23 | Discharge: 2015-07-23 | Disposition: A | Discharge status: Home / Self Care | Payer: No Typology Code available for payment source | Source: Ambulatory Visit | Attending: Critical Care Medicine | Admitting: Critical Care Medicine

## 2015-07-23 DIAGNOSIS — R911 Solitary pulmonary nodule: Secondary | ICD-10-CM | POA: Diagnosis not present

## 2015-07-30 ENCOUNTER — Encounter: Payer: Self-pay | Admitting: Adult Health

## 2015-07-30 ENCOUNTER — Ambulatory Visit (INDEPENDENT_AMBULATORY_CARE_PROVIDER_SITE_OTHER): Payer: No Typology Code available for payment source | Admitting: Adult Health

## 2015-07-30 VITALS — BP 124/76 | HR 82 | Temp 98.4°F | Ht 69.0 in | Wt 258.0 lb

## 2015-07-30 DIAGNOSIS — Z23 Encounter for immunization: Secondary | ICD-10-CM

## 2015-07-30 DIAGNOSIS — J45991 Cough variant asthma: Secondary | ICD-10-CM

## 2015-07-30 DIAGNOSIS — R911 Solitary pulmonary nodule: Secondary | ICD-10-CM

## 2015-07-30 NOTE — Assessment & Plan Note (Signed)
Mild flare of AR , add allegra   Plan  May use Allegra daily As needed  Drainage  Cont on chlortrimeton At bedtime   Saline nasal As needed

## 2015-07-30 NOTE — Patient Instructions (Signed)
We are setting you up for a PET scan.  Pending these results will decide on next follow up .  Follow up with Dr. Ashok Cordia in 2 weeks to discuss results.  May use Allegra daily As needed  Drainage

## 2015-07-30 NOTE — Assessment & Plan Note (Signed)
Most recent CT chest showed slight growth  Will need to set up for PET scan .  Previous spirometry shows normal lung function. In 2014.  follow up in 2 weeks to discuss results.

## 2015-07-30 NOTE — Progress Notes (Signed)
   Subjective:    Patient ID: Anna Larson, female    DOB: [birth year], 49 y.o.   MRN: [id number]  HPI 49 yo female never smoker with cough variant asthma and lung nodule  Previous Dr. Joya Gaskins  Patient.   07/30/2015 Follow up : Asthma/Lung nodule  Pt returns for follow up for asthma.  Says breathing is doing okay, except lately with nasal drainage, dry cough and mild DOE .  Taking chlor tabs At bedtime  .  No fever, chest pain, hemoptysis , discolored mucus.  Works in Immunologist at schools.  Never smoker, second hand smoke as child.   Pt with known lung nodule seen in 2014 in [facility] . follow up CT in 2015 with no change  Repeat CT chest 07/23/15 showed slight increased in nodule at 10x57m.  She has no weight loss. She is never smoker . No occupational exposure.  Discussed  Need for a PET scan .    Review of Systems Constitutional:   No  weight loss, night sweats,  Fevers, chills, fatigue, or  lassitude.  HEENT:   No headaches,  Difficulty swallowing,  Tooth/dental problems, or  Sore throat,                No sneezing, itching, ear ache,  +nasal congestion, post nasal drip,   CV:  No chest pain,  Orthopnea, PND, swelling in lower extremities, anasarca, dizziness, palpitations, syncope.   GI  No heartburn, indigestion, abdominal pain, nausea, vomiting, diarrhea, change in bowel habits, loss of appetite, bloody stools.   Resp: No shortness of breath with exertion or at rest.  No excess mucus, no productive cough,  No non-productive cough,  No coughing up of blood.  No change in color of mucus.  No wheezing.  No chest wall deformity  Skin: no rash or lesions.  GU: no dysuria, change in color of urine, no urgency or frequency.  No flank pain, no hematuria   MS:  No joint pain or swelling.  No decreased range of motion.  No back pain.  Psych:  No change in mood or affect. No depression or anxiety.  No memory loss.         Objective:   Physical Exam GEN: A/Ox3; pleasant , NAD, well  nourished   HEENT:  /AT,  EACs-clear, TMs-wnl, NOSE-clear, THROAT-clear, no lesions, no postnasal drip or exudate noted.   NECK:  Supple w/ fair ROM; no JVD; normal carotid impulses w/o bruits; no thyromegaly or nodules palpated; no lymphadenopathy.  RESP  Clear  P & A; w/o, wheezes/ rales/ or rhonchi.no accessory muscle use, no dullness to percussion  CARD:  RRR, no m/r/g  , no peripheral edema, pulses intact, no cyanosis or clubbing.  GI:   Soft & nt; nml bowel sounds; no organomegaly or masses detected.  Musco: Warm bil, no deformities or joint swelling noted.   Neuro: alert, no focal deficits noted.    Skin: Warm, no lesions or rashes    CT chest with 10x133mRUL pulmonary nodule shows slight increase in size when compared to 11/2013.  Reviewed personally      Assessment & Plan:

## 2015-08-10 ENCOUNTER — Ambulatory Visit (HOSPITAL_COMMUNITY)
Admission: RE | Admit: 2015-08-10 | Discharge: 2015-08-10 | Disposition: A | Discharge status: Home / Self Care | Payer: No Typology Code available for payment source | Source: Ambulatory Visit | Attending: Adult Health | Admitting: Adult Health

## 2015-08-10 DIAGNOSIS — R911 Solitary pulmonary nodule: Secondary | ICD-10-CM | POA: Diagnosis not present

## 2015-08-10 DIAGNOSIS — R948 Abnormal results of function studies of other organs and systems: Secondary | ICD-10-CM | POA: Insufficient documentation

## 2015-08-10 LAB — GLUCOSE, CAPILLARY: GLUCOSE-CAPILLARY: 93 mg/dL (ref 65–99)

## 2015-08-10 MED ORDER — FLUDEOXYGLUCOSE F - 18 (FDG) INJECTION
13.2600 | Freq: Once | INTRAVENOUS | Status: DC | PRN
  Administered 2015-08-10: 13.26 via INTRAVENOUS
  Filled 2015-08-10: qty 13.26

## 2015-08-12 ENCOUNTER — Telehealth: Payer: Self-pay | Admitting: Adult Health

## 2015-08-12 DIAGNOSIS — R911 Solitary pulmonary nodule: Secondary | ICD-10-CM

## 2015-08-12 NOTE — Progress Notes (Signed)
Quick Note:  Called and spoke with pt. Reviewed results and recs. Placed order for PFT to be done at [facility] due to no availability at our office on the day of ov with Dr. Ashok Cordia on 08/26/15 @ 230. Pt voiced understanding and agreed to PFT at [facility]. Order for CT biopsy and labs was placed and reviewed by Dr. Ashok Cordia. Pt voiced understanding and had no further questions. ______

## 2015-08-12 NOTE — Telephone Encounter (Signed)
Per TP result note on 08/11/15 Result Note     Spoke to pt on phone , and discussed case with DR. Nestor.     Will set pt up with IR for CT guided Bx of RUL nodule     Make sure has ov with Dr. Ashok Cordia with PFT , i believe has ov later this month, needs PFT     Place bx on reminder list.      Called and spoke with pt. Reviewed results and recs per TP. Verified that she would be okay with her PFT being scheduled at [facility] due to no availability in our office the day of her appointment with Dr. Ashok Cordia on 08/26/15 at 230.  Pt voiced understanding and had no further questions.   Placed order for PFT to be done at [facility] (preferabilty same day as OV with Dr. Ashok Cordia) Order for CT biopsy and labs were placed and reviewed per Dr. Ashok Cordia to verify the correct labs BX was placed in reminder list. Nothing further needed.

## 2015-08-13 ENCOUNTER — Other Ambulatory Visit: Payer: Self-pay | Admitting: Adult Health

## 2015-08-13 DIAGNOSIS — R911 Solitary pulmonary nodule: Secondary | ICD-10-CM

## 2015-08-14 ENCOUNTER — Telehealth: Payer: Self-pay | Admitting: Adult Health

## 2015-08-14 NOTE — Telephone Encounter (Signed)
Refer to Thoracic conference  Lung nodule with growth in never smoker , +PET scan  Radiology declined CT guided bx and preferred ? Surgical  Would like discussed at conference  [place] on next Thursday.  Call [place] [phone]

## 2015-08-17 NOTE — Telephone Encounter (Signed)
Called and spoke to [place] @ [phone] Verified that pt will be discussed at conference on Thursday Oct 13th, 2016. I informed TP of confirmation Nothing further needed.

## 2015-08-19 ENCOUNTER — Ambulatory Visit: Payer: No Typology Code available for payment source | Admitting: Pulmonary Disease

## 2015-08-21 ENCOUNTER — Telehealth: Payer: Self-pay | Admitting: *Deleted

## 2015-08-21 NOTE — Telephone Encounter (Signed)
lmomtcb x1 Advised to ask to speak with Audry Pecina

## 2015-08-21 NOTE — Telephone Encounter (Signed)
Patient returned call, may be reached at [phone].

## 2015-08-21 NOTE — Telephone Encounter (Signed)
Pt already scheduled to see JN next week. Nothing further needed

## 2015-08-21 NOTE — Telephone Encounter (Signed)
-----   Message from Javier Glazier, MD sent at 08/21/2015  9:00 AM EDT ----- Regarding: Patient Appointment Mindy,   Can you schedule an appointment for me with this patient next week to discuss her scans and our Thoracic Oncology Conference recommendations.  Thanks,  [employer]

## 2015-08-26 ENCOUNTER — Ambulatory Visit (INDEPENDENT_AMBULATORY_CARE_PROVIDER_SITE_OTHER): Payer: No Typology Code available for payment source | Admitting: Pulmonary Disease

## 2015-08-26 ENCOUNTER — Telehealth: Payer: Self-pay | Admitting: Pulmonary Disease

## 2015-08-26 ENCOUNTER — Ambulatory Visit (HOSPITAL_COMMUNITY)
Admission: RE | Admit: 2015-08-26 | Discharge: 2015-08-26 | Disposition: A | Discharge status: Home / Self Care | Payer: PRIVATE HEALTH INSURANCE | Source: Ambulatory Visit | Attending: Adult Health | Admitting: Adult Health

## 2015-08-26 ENCOUNTER — Other Ambulatory Visit (INDEPENDENT_AMBULATORY_CARE_PROVIDER_SITE_OTHER): Payer: No Typology Code available for payment source

## 2015-08-26 ENCOUNTER — Encounter: Payer: Self-pay | Admitting: Pulmonary Disease

## 2015-08-26 VITALS — BP 124/78 | HR 86 | Ht 69.0 in | Wt 258.0 lb

## 2015-08-26 DIAGNOSIS — J309 Allergic rhinitis, unspecified: Secondary | ICD-10-CM | POA: Insufficient documentation

## 2015-08-26 DIAGNOSIS — K219 Gastro-esophageal reflux disease without esophagitis: Secondary | ICD-10-CM | POA: Diagnosis not present

## 2015-08-26 DIAGNOSIS — R0609 Other forms of dyspnea: Secondary | ICD-10-CM | POA: Diagnosis not present

## 2015-08-26 DIAGNOSIS — R05 Cough: Secondary | ICD-10-CM | POA: Insufficient documentation

## 2015-08-26 DIAGNOSIS — R911 Solitary pulmonary nodule: Secondary | ICD-10-CM

## 2015-08-26 DIAGNOSIS — IMO0001 Reserved for inherently not codable concepts without codable children: Secondary | ICD-10-CM

## 2015-08-26 DIAGNOSIS — J449 Chronic obstructive pulmonary disease, unspecified: Secondary | ICD-10-CM

## 2015-08-26 LAB — CBC WITH DIFFERENTIAL/PLATELET
BASOS PCT: 0.6 % (ref 0.0–3.0)
Basophils Absolute: 0 10*3/uL (ref 0.0–0.1)
EOS ABS: 0.1 10*3/uL (ref 0.0–0.7)
Eosinophils Relative: 0.7 % (ref 0.0–5.0)
HCT: 28.8 % — ABNORMAL LOW (ref 36.0–46.0)
Hemoglobin: 8.1 g/dL — ABNORMAL LOW (ref 12.0–15.0)
LYMPHS PCT: 28.7 % (ref 12.0–46.0)
Lymphs Abs: 2.4 10*3/uL (ref 0.7–4.0)
MCHC: 28.1 g/dL — ABNORMAL LOW (ref 30.0–36.0)
MCV: 55.6 fl — ABNORMAL LOW (ref 78.0–100.0)
MONO ABS: 0.6 10*3/uL (ref 0.1–1.0)
Monocytes Relative: 7.4 % (ref 3.0–12.0)
NEUTROS PCT: 62.6 % (ref 43.0–77.0)
Neutro Abs: 5.2 10*3/uL (ref 1.4–7.7)
PLATELETS: 186 10*3/uL (ref 150.0–400.0)
RBC: 5.18 Mil/uL — ABNORMAL HIGH (ref 3.87–5.11)
RDW: 22.3 % — AB (ref 11.5–15.5)
WBC: 8.3 10*3/uL (ref 4.0–10.5)

## 2015-08-26 LAB — PULMONARY FUNCTION TEST
DL/VA % PRED: 82 %
DL/VA: 4.24 ml/min/mmHg/L
DLCO UNC: 19.73 ml/min/mmHg
DLCO unc % pred: 69 %
FEF 25-75 POST: 0.72 L/s
FEF 25-75 Pre: 1.86 L/sec
FEF2575-%Change-Post: -61 %
FEF2575-%PRED-PRE: 62 %
FEF2575-%Pred-Post: 23 %
FEV1-%Change-Post: -49 %
FEV1-%PRED-POST: 33 %
FEV1-%PRED-PRE: 66 %
FEV1-Post: 1.05 L
FEV1-Pre: 2.07 L
FEV1FVC-%Change-Post: -38 %
FEV1FVC-%PRED-PRE: 83 %
FEV6-%Change-Post: -17 %
FEV6-%PRED-POST: 66 %
FEV6-%Pred-Pre: 80 %
FEV6-POST: 2.56 L
FEV6-Pre: 3.09 L
FEV6FVC-%CHANGE-POST: 0 %
FEV6FVC-%PRED-POST: 102 %
FEV6FVC-%Pred-Pre: 103 %
FVC-%Change-Post: -17 %
FVC-%Pred-Post: 65 %
FVC-%Pred-Pre: 78 %
FVC-Post: 2.56 L
FVC-Pre: 3.09 L
POST FEV6/FVC RATIO: 100 %
PRE FEV1/FVC RATIO: 67 %
Post FEV1/FVC ratio: 41 %
Pre FEV6/FVC Ratio: 100 %
RV % pred: 61 %
RV: 1.18 L
TLC % PRED: 77 %
TLC: 4.26 L

## 2015-08-26 MED ORDER — BUDESONIDE-FORMOTEROL FUMARATE 80-4.5 MCG/ACT IN AERO: 2.0000 | INHALATION_SPRAY | Freq: Two times a day (BID) | RESPIRATORY_TRACT | Status: DC

## 2015-08-26 MED ORDER — ALBUTEROL SULFATE (2.5 MG/3ML) 0.083% IN NEBU
2.5000 mg | INHALATION_SOLUTION | Freq: Once | RESPIRATORY_TRACT | Status: AC
  Administered 2015-08-26: 2.5 mg via RESPIRATORY_TRACT

## 2015-08-26 NOTE — Progress Notes (Signed)
Subjective:    Patient ID: Anna Larson, female    DOB: 1966/09/19, 49 y.o.   MRN: [id number]  C.C.:  Follow-up for RUL Lung Nodule, Asthma/COPD overlap, & GERD.  HPI RUL Lung Nodule: Mildly increased in size on repeat CT imaging. Positive on PET/CT imaging. Discussed at our multidisciplinary thoracic oncology conference. No focal vision loss, weakness, or numbness.  Asthma/COPD Overlap: Patient has moderate COPD based on spirometry today. [place] stopped at last appointment with Dr. Joya Gaskins. She denies any dyspnea at rest. She does have dyspnea & a cough when climbing the stairs from her basement to main level. Reports her cough is nonproductive. She does get near syncopal with her coughing spells. She uses her rescue inhaler 1-2 times per month. No exacerbations since last appointment. She had no breathing problems or bronchitis as a child.  GERD: Recommended to use Pepcid or Zantac daily at bedtime at last appointment while continuing Prilosec twice a day. No odynophagia. She does have dysphagia occasionally. She reports reflux now only once a month. No diarrhea.  Review of Systems She does have some chest discomfort on her left side anteriorly as well as posteriorly. No adenopathy in her neck, groin, or axilla. No fever, chills, or sweats. No weight loss.   Allergies  Allergen Reactions  . Codeine     Current Outpatient Prescriptions on File Prior to Visit  Medication Sig Dispense Refill  . albuterol (PROVENTIL HFA;VENTOLIN HFA) 108 (90 BASE) MCG/ACT inhaler Inhale 2 puffs into the lungs every 6 (six) hours as needed for wheezing.    . benzonatate (TESSALON) 100 MG capsule Take 1-2 every 4 hours as needed for cough 90 capsule 4  . chlorpheniramine (CHLOR-TRIMETON) 4 MG tablet Take 3 tablets (12 mg total) by mouth daily. 90 tablet 2  . diclofenac (VOLTAREN) 75 MG EC tablet Take 1 tablet by mouth 2 (two) times daily as needed.      No current facility-administered medications on file  prior to visit.    Past Medical History  Diagnosis Date  . Cough   . Asthma   . COPD (chronic obstructive pulmonary disease) ([place])   . GERD (gastroesophageal reflux disease)   . Lung nodule < 6cm on CT   . Allergic rhinitis     Past Surgical History  Procedure Laterality Date  . Tubal ligation      Family History  Problem Relation Age of Onset  . Bone cancer Maternal Grandmother   . Multiple sclerosis Mother   . Prostate cancer Paternal Grandfather   . GER disease Father   . Emphysema Maternal Grandfather     Social History   Social History  . Marital Status: Married    Spouse Name: N/A  . Number of Children: N/A  . Years of Education: N/A   Occupational History  . [employer]    Social History Main Topics  . Smoking status: Passive Smoke Exposure - Never Smoker  . Smokeless tobacco: Never Used     Comment: both mothers smoked  . Alcohol Use: No  . Drug Use: None  . Sexual Activity: Not Asked   Other Topics Concern  . None   Social History Narrative   Originally from [place]. Has always lived in [place]. Has traveled to [facility]. No international travel. Works in the [employer] system in [employer]. Has an outdoor dog. No bird, mold, or hot tube exposure. Enjoys cross stitching.       Objective:   Physical Exam Blood  pressure 124/78, pulse 86, height '5\' 9"'$  (1.753 m), weight 258 lb (117.028 kg), last menstrual period 08/03/2015, SpO2 100 %. General:  Awake. Alert. No acute distress. Accompanied by mother and father today. Obese Caucasian female. Integument:  Warm & dry. No rash on exposed skin. No bruising. HEENT:  Moist mucus membranes. No oral ulcers. No scleral injection or icterus. Mallampati class III Cardiovascular:  Regular rate & rhythm. No appreciable JVD given body habitus.   Pulmonary:  Clear to auscultation bilaterally. Normal work of breathing on room air. Abdomen: Soft. Normal bowel sounds. Protuberant. Musculoskeletal:  Normal bulk and tone. No  joint deformity or effusion appreciated.  PFT 08/26/15: FVC 3.09 L (78%) FEV1 2.07 L (66%) FEV1/FVC 0.67 FEF 25-75 1.86 L (62%) no bronchodilator response TLC 4.26 L (77%) RV 71% ERV 15% DLCO uncorrected 69% 04/04/13: FVC 3.20 L (80%) FEV1 2.50 L (77%) FEV1/FVC 0.78 FEF 25-75 2.46 L (75%)  IMAGING PET CT 08/10/15 (personally reviewed by me): Right upper lobe nodule with max SUV 3.8. No pathologic mediastinal or hilar adenopathy. Left-sided axillary lymph node with max SUV 2.7. Multiple small inguinal lymph nodes. Diffuse increased metabolic activity in the bony structures most notably the spine and pelvis as well as muscular activity.    Assessment & Plan:  49 year old Caucasian female previously diagnosed with cough variant asthma. Her spirometry today shows moderate airways obstruction without a significant bronchodilator response. Additionally, she has a decreased carbon dioxide diffusion capacity and mild restriction which is likely secondary to her central obesity. She does have significant secondhand smoke exposure through her mother's growing up but none personally. I personally reviewed her PET CT scan images with her and her family during the visit today explaining the potential options for biopsy and treatment. If this does indeed represent a malignancy as I suspect the most appropriate course of action would be a lobectomy. However, we will attempt to discern whether or not the lesion can be accessed with a navigational bronchoscopy to obtain biopsy specimen and determine exactly what we are dealing with. [name] reflux at this time seems to be well-controlled on her current regimen. I instructed the patient and her family to contact me if they had any further questions or concerns.  1. Right upper lobe nodule: Obtaining CT scan with appropriate formatting to perform navigational bronchoscopy. Plan to consider navigational bronchoscopy with biopsy depending upon the results. Patient may  require right upper lobectomy. 2. Asthma with moderate airways obstruction: Starting the patient on Symbicort 80/4.5. Instructed her on proper oral hygiene. Checking for alpha-1 antitrypsin deficiency. Also checking serum CBC with differential & IgE. 3. GERD: Well-controlled on her current regimen of Prilosec. No changes. 4. Follow-up: Patient to return to clinic in 4-6 weeks but I will be in contact with her regarding the results of her imaging.

## 2015-08-26 NOTE — Addendum Note (Signed)
Addended by: Inge Rise on: 08/26/2015 03:57 PM   Modules accepted: Orders

## 2015-08-26 NOTE — Telephone Encounter (Signed)
Spoke with the patient regarding her anemia found today on labs. Reports she has been anemic for over 1 year. Previously saw hematology in [facility]. Denies any prior endoscopies. Denies any melena or hematochezia. She is having menses every month & only once a month. These last for 7 days and only periodically are heavy flow. Previously she was on oral iron supplementation. No family history of colon cancer. I am referring the patient to GI for endoscopy ASAP. Checking Ferritin, Fe level, TIBC, B12, & FA. Also recommended she establish care with a PCP now that she has insurance.

## 2015-08-26 NOTE — Patient Instructions (Signed)
1. We are going to check a CT scan of your chest to determine whether or not we can biopsy the spot in your lung from the inside. 2. I'm checking you for the genetic form of COPD. 3. Remember to take the Symbicort inhaler 2 puffs inhaled twice daily. Remember to rinse, gargle, and spit immediately after using this inhaler to prevent thrush. Also remember to brush her teeth and pressure tone immediately after using the inhaler. 4. I'm going to recheck breathing tests such her next appointment. 5. I will see you back in 4-6 weeks but we will be in contact regarding how to proceed with your lung nodule.

## 2015-08-27 ENCOUNTER — Telehealth: Payer: Self-pay | Admitting: Pulmonary Disease

## 2015-08-27 ENCOUNTER — Other Ambulatory Visit (INDEPENDENT_AMBULATORY_CARE_PROVIDER_SITE_OTHER): Payer: PRIVATE HEALTH INSURANCE

## 2015-08-27 DIAGNOSIS — D649 Anemia, unspecified: Secondary | ICD-10-CM

## 2015-08-27 LAB — VITAMIN B12: VITAMIN B 12: 328 pg/mL (ref 211–911)

## 2015-08-27 LAB — IRON AND TIBC
%SAT: 1 % — AB (ref 11–50)
Iron: <10 ug/dL — ABNORMAL LOW (ref 40–190)
TIBC: 443 ug/dL (ref 250–450)
UIBC: 438 ug/dL — ABNORMAL HIGH (ref 125–400)

## 2015-08-27 LAB — FOLATE: Folate: 16.2 ng/mL (ref >5.9)

## 2015-08-27 LAB — FERRITIN: FERRITIN: 2.1 ng/mL — AB (ref 10.0–291.0)

## 2015-08-27 NOTE — Telephone Encounter (Signed)
Called spoke with pt. Aware order placed for labs for referral placed. Nothing further needed

## 2015-08-28 ENCOUNTER — Encounter: Payer: Self-pay | Admitting: Physician Assistant

## 2015-08-28 ENCOUNTER — Other Ambulatory Visit: Payer: Self-pay | Admitting: Pulmonary Disease

## 2015-08-28 LAB — ALPHA-1-ANTITRYPSIN: A1 ANTITRYPSIN SER: 173 mg/dL (ref 83–199)

## 2015-08-28 LAB — IGE

## 2015-08-28 MED ORDER — FERROUS SULFATE 325 (65 FE) MG PO TABS: 325.0000 mg | ORAL_TABLET | Freq: Three times a day (TID) | ORAL | Status: DC

## 2015-08-31 ENCOUNTER — Ambulatory Visit (INDEPENDENT_AMBULATORY_CARE_PROVIDER_SITE_OTHER)
Admission: RE | Admit: 2015-08-31 | Discharge: 2015-08-31 | Disposition: A | Discharge status: Home / Self Care | Payer: PRIVATE HEALTH INSURANCE | Source: Ambulatory Visit | Attending: Pulmonary Disease | Admitting: Pulmonary Disease

## 2015-08-31 DIAGNOSIS — R911 Solitary pulmonary nodule: Secondary | ICD-10-CM

## 2015-08-31 DIAGNOSIS — IMO0001 Reserved for inherently not codable concepts without codable children: Secondary | ICD-10-CM

## 2015-09-04 ENCOUNTER — Telehealth: Payer: Self-pay | Admitting: Pulmonary Disease

## 2015-09-04 DIAGNOSIS — R911 Solitary pulmonary nodule: Secondary | ICD-10-CM

## 2015-09-04 NOTE — Telephone Encounter (Signed)
Spoke with the patient. She is going to call back on Monday to let me know what her decision is on referral to CT surgery versus navigational bronchoscopy. She needs the following in the meantime: 1. Sample of Symbicort in addition to prescription drug card or assistance paperwork as the inhaler costs $300? 2. The following labs... 1. ANCA Panel 2. ANA w/ reflex to comprehensive panel 3. SSA 4. SSB 5. Rheumatoid Factor 6. Anti-CCP  Thanks.

## 2015-09-04 NOTE — Telephone Encounter (Signed)
Will call Monday regarding inhaler

## 2015-09-04 NOTE — Telephone Encounter (Signed)
Spoke with the patient regarding her recent Chest CT scan. There is an airway adjacent to her nodule that appears to pass by it making a navigational endobronchial biopsy less probable in yielding a diagnosis. I discussed referral to thoracic surgery for evaluation for wedge resection & possible lobectomy as well. The patient is going to discuss it further with her husband over the weekend and call our office on Monday informing my nurse of her decision. In the meantime I am ordering additional serum testing.

## 2015-09-04 NOTE — Telephone Encounter (Signed)
Pt is requesting her CT results. Please advise thanks

## 2015-09-07 NOTE — Telephone Encounter (Signed)
Staff message has been sent to Dr. Leonarda Salon office.  We will contact patient when Dr. Leonarda Salon office notifies [place] with appt info.

## 2015-09-07 NOTE — Telephone Encounter (Signed)
Yes please order a referral to Dr. Roxan Hockey of CT Surgery. Thank you.

## 2015-09-07 NOTE — Telephone Encounter (Signed)
lmtcb x1 for pt. 

## 2015-09-07 NOTE — Telephone Encounter (Signed)
Referral has been placed in epic. Please advise PCC's thanks

## 2015-09-07 NOTE — Telephone Encounter (Signed)
Patient called back with her decision, she has decided on referral to thoracic surgery for evaluation for wedge resection & possible lobectomy.   Symbicort left at front desk for patient to pick up, along with coupon for discount on Symbicort. Patient notified to obtain labs, lab orders have already been entered.   Dr. Ashok Cordia, do you want to order referral?  Please advise.

## 2015-09-08 ENCOUNTER — Ambulatory Visit (INDEPENDENT_AMBULATORY_CARE_PROVIDER_SITE_OTHER): Payer: PRIVATE HEALTH INSURANCE | Admitting: Physician Assistant

## 2015-09-08 ENCOUNTER — Other Ambulatory Visit (INDEPENDENT_AMBULATORY_CARE_PROVIDER_SITE_OTHER): Payer: PRIVATE HEALTH INSURANCE

## 2015-09-08 ENCOUNTER — Other Ambulatory Visit: Payer: PRIVATE HEALTH INSURANCE

## 2015-09-08 ENCOUNTER — Encounter: Payer: Self-pay | Admitting: Physician Assistant

## 2015-09-08 VITALS — BP 128/86 | HR 88 | Ht 67.0 in | Wt 253.5 lb

## 2015-09-08 DIAGNOSIS — R911 Solitary pulmonary nodule: Secondary | ICD-10-CM

## 2015-09-08 DIAGNOSIS — D509 Iron deficiency anemia, unspecified: Secondary | ICD-10-CM | POA: Diagnosis not present

## 2015-09-08 LAB — CBC WITH DIFFERENTIAL/PLATELET
BASOS ABS: 0 10*3/uL (ref 0.0–0.1)
Basophils Relative: 0.2 % (ref 0.0–3.0)
EOS ABS: 0.1 10*3/uL (ref 0.0–0.7)
Eosinophils Relative: 1.2 % (ref 0.0–5.0)
HCT: 33.1 % — ABNORMAL LOW (ref 36.0–46.0)
Hemoglobin: 9.4 g/dL — ABNORMAL LOW (ref 12.0–15.0)
LYMPHS PCT: 27.9 % (ref 12.0–46.0)
Lymphs Abs: 1.7 10*3/uL (ref 0.7–4.0)
MCHC: 28.3 g/dL — ABNORMAL LOW (ref 30.0–36.0)
MCV: 58.6 fl — ABNORMAL LOW (ref 78.0–100.0)
MONO ABS: 0.5 10*3/uL (ref 0.1–1.0)
Monocytes Relative: 7.5 % (ref 3.0–12.0)
NEUTROS PCT: 63.2 % (ref 43.0–77.0)
Neutro Abs: 3.9 10*3/uL (ref 1.4–7.7)
PLATELETS: 271 10*3/uL (ref 150.0–400.0)
RBC: 5.65 Mil/uL — AB (ref 3.87–5.11)
RDW: 26.1 % — ABNORMAL HIGH (ref 11.5–15.5)
WBC: 6.1 10*3/uL (ref 4.0–10.5)

## 2015-09-08 LAB — IGA: IGA: 121 mg/dL (ref 68–378)

## 2015-09-08 NOTE — Progress Notes (Signed)
Patient ID: ELLI Larson, female   DOB: [birth year], 49 y.o.   MRN: [id number]   Subjective:    Patient ID: Anna Larson, female    DOB: December 19, 1965, 49 y.o.   MRN: [id number]  HPI Anna Larson is a pleasant 49 year old white female new to GI referred by Dr. Ashok Larson for evaluation of new iron deficiency anemia. Patient has been undergoing pulmonary evaluation for a right upper lobe lung nodule. She had recent CT done on 08/31/2015 which showed a persistent right upper lobe lesion concerning for low-grade neoplasm. She is in the process of being referred to a surgeon for partial lobectomy. Recent labs were done showing a hemoglobin of 8.1 hematocrit of 28.8 MCV of 55 platelets 186, ferritin 2.1 serum iron less than 10 TIBC 443 9 saturation of 1. Patient states that she was told that she was anemic a few years ago and was seen by a hematologist in [facility] though she cannot recall the name. She was placed on oral iron but did not undergo any other evaluation. She has not had transfusion requirement and has not had intravenous iron infusion. She says she has been fatigued for some  Time.and does have some exertional dyspnea at times. She is still menstruating and usually has menses lasting 5-6 days she has not noticed any increase in bleeding etc. over the past couple of years. She did have recent GYN evaluation by Anna Larson in [facility]. Patient has no complaints of abdominal pain and changes in bowel habits melena or hematochezia. No complaint of heartburn or indigestion on a regular basis. No dysphagia. There is no family history of colon cancers or celiac disease.  Review of Systems Pertinent positive and negative review of systems were noted in the above HPI section.  All other review of systems was otherwise negative.  Outpatient Encounter Prescriptions as of 09/08/2015  Medication Sig  . budesonide-formoterol (SYMBICORT) 80-4.5 MCG/ACT inhaler Inhale 2 puffs into the lungs 2 (two) times daily.  .  chlorpheniramine (CHLOR-TRIMETON) 4 MG tablet Take 3 tablets (12 mg total) by mouth daily.  . ferrous sulfate 325 (65 FE) MG tablet Take 1 tablet (325 mg total) by mouth 3 (three) times daily with meals.  . [DISCONTINUED] albuterol (PROVENTIL HFA;VENTOLIN HFA) 108 (90 BASE) MCG/ACT inhaler Inhale 2 puffs into the lungs every 6 (six) hours as needed for wheezing.  . [DISCONTINUED] benzonatate (TESSALON) 100 MG capsule Take 1-2 every 4 hours as needed for cough  . [DISCONTINUED] diclofenac (VOLTAREN) 75 MG EC tablet Take 1 tablet by mouth 2 (two) times daily as needed.    No facility-administered encounter medications on file as of 09/08/2015.   Allergies  Allergen Reactions  . Codeine    Patient Active Problem List   Diagnosis Date Noted  . COPD (chronic obstructive pulmonary disease) ([place]) 08/26/2015  . Allergic rhinitis 08/26/2015  . GERD (gastroesophageal reflux disease) 08/26/2015  . Lung nodule seen on imaging study   . Cough variant asthma    Social History   Social History  . Marital Status: Married    Spouse Name: N/A  . Number of Children: 2  . Years of Education: N/A   Occupational History  . [place] History Main Topics  . Smoking status: Passive Smoke Exposure - Never Smoker  . Smokeless tobacco: Never Used     Comment: both mothers smoked  . Alcohol Use: 0.0 oz/week    0 Standard drinks or equivalent per week  Comment: occasional  . Drug Use: No  . Sexual Activity: Not on file   Other Topics Concern  . Not on file   Social History Narrative   Originally from [place]. Has always lived in [place]. Has traveled to [facility]. No international travel. Works in the [employer] system in [employer]. Has an outdoor dog. No bird, mold, or hot tube exposure. Enjoys cross stitching.     Anna Larson's family history includes Bone cancer in her maternal grandmother; Emphysema in her maternal grandfather; GER disease in her father; Multiple  sclerosis in her mother; Prostate cancer in her paternal grandfather.      Objective:    Filed Vitals:   09/08/15 0857  BP: 128/86  Pulse: 88    Physical Exam  well-developed white female in no acute distress, pleasant blood pressure 128/86 pulse 88 height 5 foot 7 weight 253, BMI 39.6. HEENT; nontraumatic normocephalic EOMI PERRLA sclera anicteric, Cardiovascular; regular rate and rhythm with S1-S2 no murmur or gallop, Pulmonary; clear bilaterally, Abdomen ;soft nontender nondistended bowel sounds are active there is no palpable mass or hepatosplenomegaly up rectal exam not done, Extremities; no clubbing cyanosis or edema skin warm and dry, Neuropsych; mood and affect appropriate       Assessment & Plan:   #1 49 yo female with new dx of iron deficiency anemia- R/O chronic GI blood loss,R/O malabsorption, no menorrhagia by hx. #2 RUL lung  Nodule/lesion- workup in progress- to be referred for surgery #3 COPD #4 GERD  Plan; Patient will continue ferrous sulfate 325 mg by mouth 3 times a day Repeat CBC today, check  hemosure  and TTG and IgA Schedule for colonoscopy and EGD with Dr. Carlean Purl. Procedures discussed in detail with patient and she is agreeable to proceed.   Anna Filter S Kalani Baray PA-C 09/08/2015   Cc: No ref. provider found

## 2015-09-08 NOTE — Patient Instructions (Addendum)
You have been scheduled for an endoscopy and colonoscopy. Please follow the written instructions given to you at your visit today. Please pick up your prep supplies at the pharmacy within the next 1-3 days. If you use inhalers (even only as needed), please bring them with you on the day of your procedure. Your physician has requested that you go to www.startemmi.com and enter the access code given to you at your visit today. This web site gives a general overview about your procedure. However, you should still follow specific instructions given to you by our office regarding your preparation for the procedure.  Your physician has requested that you go to the basement for the following lab work before leaving today: CBC, IgA, [place], IFOB

## 2015-09-09 ENCOUNTER — Encounter: Payer: Self-pay | Admitting: Thoracic Surgery (Cardiothoracic Vascular Surgery)

## 2015-09-09 ENCOUNTER — Institutional Professional Consult (permissible substitution) (INDEPENDENT_AMBULATORY_CARE_PROVIDER_SITE_OTHER): Payer: PRIVATE HEALTH INSURANCE | Admitting: Thoracic Surgery (Cardiothoracic Vascular Surgery)

## 2015-09-09 VITALS — BP 117/83 | HR 100 | Resp 16 | Ht 67.0 in | Wt 258.0 lb

## 2015-09-09 DIAGNOSIS — R911 Solitary pulmonary nodule: Secondary | ICD-10-CM | POA: Diagnosis not present

## 2015-09-09 LAB — SJOGRENS SYNDROME-B EXTRACTABLE NUCLEAR ANTIBODY: SSB (La) (ENA) Antibody, IgG: <1

## 2015-09-09 LAB — SJOGRENS SYNDROME-A EXTRACTABLE NUCLEAR ANTIBODY: SSA (RO) (ENA) ANTIBODY, IGG: NEGATIVE

## 2015-09-09 LAB — CYCLIC CITRUL PEPTIDE ANTIBODY, IGG: Cyclic Citrullin Peptide Ab: <16 Units

## 2015-09-09 LAB — ANCA SCREEN W REFLEX TITER: ANCA SCREEN: NEGATIVE

## 2015-09-09 LAB — TISSUE TRANSGLUTAMINASE, IGA: TISSUE TRANSGLUTAMINASE AB, IGA: 1 U/mL (ref <4)

## 2015-09-09 LAB — ANA W/REFLEX: ANA: NEGATIVE

## 2015-09-09 NOTE — Progress Notes (Signed)
[address]       [place] [address]             [phone]      PCP is No PCP Per Patient Referring Provider is Javier Glazier, MD  Chief Complaint  Patient presents with  . Lung Lesion    Surgical eval, Chest CT-super D, PET Scan 08/10/15    HPI: 49 year old woman with a past medical history significant for asthma, COPD, gastroesophageal reflux, nephrolithiasis, arthritis, chronic cough, and obesity. She is a lifelong nonsmoker. She was being evaluated for a chronic cough in 2014. She saw Dr. Joya Gaskins. As part of the workup he did a CT of the chest. There was an 11 x 7 mm nodule in the right upper lobe. She has been followed since that time.  She recently had a follow-up CT which showed the nodule had increased in size. A PET CT was done. The nodule was hypermetabolic with an SUV of 3.8. There was no hilar or mediastinal adenopathy.  She has never smoked. She denies change in appetite or weight loss. She does complain of decreased energy. She has chronic shortness of breath with exertion and a chronic dry cough. She also complains of swelling in her left ankle (due to arthritis). She has not had any unusual headaches or visual changes. She occasionally has wheezing, but has not had any recently. She works full-time.  Zubrod Score: At the time of surgery this patient's most appropriate activity status/level should be described as: '[x]'$     0    Normal activity, no symptoms '[]'$     1    Restricted in physical strenuous activity but ambulatory, able to do out light work '[]'$     2    Ambulatory and capable of self care, unable to do work activities, up and about >50 % of waking hours                              '[]'$     3    Only limited self care, in bed greater than 50% of waking hours '[]'$     4    Completely disabled, no self care, confined to bed or chair '[]'$     5    Moribund    Past Medical History  Diagnosis Date  . Cough   . Asthma   . COPD (chronic  obstructive pulmonary disease) ([place])   . GERD (gastroesophageal reflux disease)   . Lung nodule < 6cm on CT   . Allergic rhinitis   . Arthritis   . Kidney stone     Past Surgical History  Procedure Laterality Date  . Tubal ligation    . Lithotripsy    . Cesarean section      Family History  Problem Relation Age of Onset  . Bone cancer Maternal Grandmother   . Cancer Maternal Grandmother     bone  . Multiple sclerosis Mother   . Prostate cancer Paternal Grandfather   . GER disease Father   . Emphysema Maternal Grandfather   . Cancer Maternal Grandfather     emphysema    Social History Social History  Substance Use Topics  . Smoking status: Passive Smoke Exposure - Never Smoker  . Smokeless tobacco: Never Used     Comment: both mothers smoked  . Alcohol Use: 0.0 oz/week    0 Standard drinks or equivalent per week  Comment: occasional    Current Outpatient Prescriptions  Medication Sig Dispense Refill  . budesonide-formoterol (SYMBICORT) 80-4.5 MCG/ACT inhaler Inhale 2 puffs into the lungs 2 (two) times daily. 1 Inhaler 3  . chlorpheniramine (CHLOR-TRIMETON) 4 MG tablet Take 3 tablets (12 mg total) by mouth daily. (Patient taking differently: Take 12 mg by mouth at bedtime. ) 90 tablet 2  . ferrous sulfate 325 (65 FE) MG tablet Take 1 tablet (325 mg total) by mouth 3 (three) times daily with meals. 90 tablet 3   No current facility-administered medications for this visit.    Allergies  Allergen Reactions  . Codeine Rash    Review of Systems  Constitutional: Negative for fever, activity change, appetite change, fatigue and unexpected weight change.  HENT: Negative for congestion, dental problem and nosebleeds.   Eyes: Negative for photophobia and visual disturbance.  Respiratory: Positive for cough (nonproductive), shortness of breath (With exertion) and wheezing.   Cardiovascular: Positive for leg swelling (Left ankle and foot). Negative for chest pain and  palpitations.  Gastrointestinal: Negative for diarrhea, constipation and blood in stool.       Heartburn  Endocrine: Negative for cold intolerance and heat intolerance.  Genitourinary: Negative for hematuria and difficulty urinating.       History of kidney stones  Musculoskeletal: Positive for joint swelling (left ankle) and arthralgias.  Skin: Negative for color change and rash.  Neurological: Negative for dizziness, syncope, weakness and headaches.  Hematological: Negative for adenopathy. Does not bruise/bleed easily.       Anemic  All other systems reviewed and are negative.   BP 117/83 mmHg  Pulse 100  Resp 16  Ht '5\' 7"'$  (1.702 m)  Wt 258 lb (117.028 kg)  BMI 40.40 kg/m2  SpO2 98%  LMP 08/28/2015 Physical Exam  Constitutional: She is oriented to person, place, and time. No distress.  Obese  HENT:  Head: Normocephalic and atraumatic.  Mouth/Throat: Oropharynx is clear and moist. No oropharyngeal exudate.  Eyes: Conjunctivae and EOM are normal. Pupils are equal, round, and reactive to light. No scleral icterus.  Neck: Normal range of motion. No tracheal deviation present. No thyromegaly present.  Cardiovascular: Normal rate, regular rhythm, normal heart sounds and intact distal pulses.  Exam reveals no gallop and no friction rub.   No murmur heard. Pulmonary/Chest: Effort normal and breath sounds normal. No accessory muscle usage. No respiratory distress. She has no wheezes. She has no rales.  Abdominal: Soft. She exhibits no distension. There is no tenderness.  Musculoskeletal: Normal range of motion. She exhibits edema (Trace edema left ankle). She exhibits no tenderness.  Lymphadenopathy:    She has no cervical adenopathy.  Neurological: She is alert and oriented to person, place, and time. She exhibits normal muscle tone. Coordination normal.  Motor 5 out of 5 bilaterally  Skin: Skin is warm. No rash noted. She is not diaphoretic.  Psychiatric: She has a normal mood and  affect.  Vitals reviewed.    Diagnostic Tests: CT CHEST WITHOUT CONTRAST  TECHNIQUE: Multidetector CT imaging of the chest was performed using thin slice collimation for electromagnetic bronchoscopy planning purposes, without intravenous contrast.  COMPARISON: Multiple exams, including 07/23/2015  FINDINGS: Mediastinum/Lymph Nodes: Small mediastinal lymph nodes are not pathologically enlarged.  Lungs/Pleura: Different slice selection on today' s exam leads to a different longitudinal measurement of the right upper lobe lesion, but the lesion continues to measure 10 mm in thickness. Compensating for the slice selection issue, the longitudinal size the lesion is  stable. The lesion has a branching distal margin and sleeves slightly larger than on 02/07/2013. No other nodules. This lesion was mildly hypermetabolic on prior PET-CT.  Upper abdomen: Unremarkable  Musculoskeletal: Small hemangioma in the T6 vertebra.  IMPRESSION: 1. Stable size of the right upper lobe lesion from 07/23/2015. Although the morphologic characteristics could be seen in a bronchocele, the fact that this was mildly hypermetabolic on PET-CT indicates the likelihood of a low-grade neoplasm. No findings of adenopathy or metastatic disease.   Electronically Signed  By: Van Clines M.D.  On: 08/31/2015 17:59      NUCLEAR MEDICINE PET SKULL BASE TO THIGH  TECHNIQUE: 13.26 mCi F-18 FDG was injected intravenously. Full-ring PET imaging was performed from the skull base to thigh after the radiotracer. CT data was obtained and used for attenuation correction and anatomic localization.  FASTING BLOOD GLUCOSE: Value: 93 mg/dl  COMPARISON: Chest CT 07/23/2015  FINDINGS: NECK  Moderate activity noted bilaterally in the tonsillar regions could be due to tonsillar inflammation and/or lymphoid hypertrophy. No enlarged neck nodes. Slight increased activity in both  submandibular glands. Slight activity noted in the submental region also.  CHEST  The 14 mm right upper lobe pulmonary lesion is metabolically active with SUV max of 3.8. No enlarged or hypermetabolic mediastinal or hilar lymph nodes. No other worrisome pulmonary lesions. Small scattered bilateral axillary lymph nodes with mild hypermetabolism. A left-sided axillary lymph node has an SUV max of 2.7.  ABDOMEN/PELVIS  No abnormal hypermetabolic activity within the liver, pancreas, adrenal glands, or spleen. No hypermetabolic lymph nodes in the abdomen or pelvis. Mild activity in the endometrium is likely due to secretory phase of ovulation. Multiple small inguinal lymph nodes with mild hypermetabolism. The left measures 3.9 and the right measures 3.8.  SKELETON  Diffuse increased metabolic activity in the bony structures, most notably the spine and pelvis. There are also areas of significant muscular activity without obvious lesion on the CT scan. Does this patient have some type of collagen vascular disease? Marrow stimulating drugs could have this appearance.  IMPRESSION: 1. Mild hypermetabolism in the right upper lobe pulmonary nodule suspicious for low grade neoplasm. No mediastinal or hilar lymphadenopathy. 2. Scattered small axillary and inguinal lymph nodes are mildly hypermetabolic and may suggest inflammatory change. 3. Diffuse osseous hypermetabolism began multiple areas of muscle hypermetabolism. No obvious lesions are seen on the CT scan. Findings could be due to collagen vascular disease or marrow stimulating drugs. Drugs causing muscle inflammation such as cholesterol lowering drugs would also be a consideration. 4. Diffuse and symmetric uptake in the tonsillar regions may be due to inflammation normal lymphoid hyperplasia.   Electronically Signed  By: Marijo Sanes M.D.  On: 08/10/2015 15:11  Pulmonary function testing FVC 3.09 (78%) FEV1 2.07  (66%) FEV1 postbronchodilator 1.05 (33%) ?! DLCO 69%  I personally reviewed the CT and PET CT and concur with the findings and the official report above.  Impression: Mrs. Anna Larson is a 49 year old woman with a 10 x 18 mm nodule in the right upper lobe that is hypermetabolic on PET CT with an SUV max of 3.8. She is a lifelong nonsmoker. This nodule is relatively smoothly marginated and has grown very slowly over time. 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 I cannot rule out the possibility of a low-grade adenocarcinoma. I reviewed the scans and discussed the differential diagnosis with Mrs. Tootle and her family.  She has a history of COPD. Her coronary function testing showed a mixed restrictive  and obstructive pattern. Her FEV1 was 2.07. Not sure what to make of her postbronchodilator result of 1.05. Her clinical symptomatology is much more consistent with a prebronchodilator number. I do think she has adequate reserve to tolerate a pulmonary resection.  My recommendation to Mrs. Hun was that we proceed with surgical resection for definitive diagnosis and treatment. We discussed the options of continued radiographic follow-up, CT guided biopsy, and bronchoscopic biopsy. We discussed the relative advantages and disadvantages of each of these approaches.  I described the proposed operation to Mrs. Bink and her family in detail. We will plan to do a right VATS and right upper lobe segmentectomy (simple wedge resection if anatomically feasible). If frozen section revealed carcinoma we will proceed with a right upper lobectomy. I informed them of the general nature of the procedure, the need for general anesthesia, the incisions to be used, the expected hospital stay, and the overall recovery. I reviewed the indications, risks, benefits, and alternatives. They understand the risk include, but are not limited to death, MI, DVT, PE, bleeding, possible need for transfusion, infection, prolonged air leak, cardiac  arrhythmias, as well as the possibility of other unforeseeable complications. She does have mildly increased risk for some complications due to her morbid obesity, but overall is a low risk candidate.  She understands and accepts the risk and agrees to proceed  Plan: Right VATS, right upper lobe segmentectomy, possible lobectomy on Monday, 10/12/2015  [name], MD [facility] [phone]

## 2015-09-10 ENCOUNTER — Other Ambulatory Visit: Payer: Self-pay | Admitting: *Deleted

## 2015-09-10 DIAGNOSIS — R911 Solitary pulmonary nodule: Secondary | ICD-10-CM

## 2015-09-11 ENCOUNTER — Encounter (HOSPITAL_COMMUNITY): Payer: Self-pay | Admitting: *Deleted

## 2015-09-16 NOTE — Progress Notes (Signed)
Agree with Ms. Esterwood's assessment and plan. Elia Nunley E. Iran Rowe, MD, FACG   

## 2015-09-17 ENCOUNTER — Other Ambulatory Visit (INDEPENDENT_AMBULATORY_CARE_PROVIDER_SITE_OTHER): Payer: PRIVATE HEALTH INSURANCE

## 2015-09-17 DIAGNOSIS — D509 Iron deficiency anemia, unspecified: Secondary | ICD-10-CM | POA: Diagnosis not present

## 2015-09-17 LAB — FECAL OCCULT BLOOD, IMMUNOCHEMICAL: Fecal Occult Bld: NEGATIVE

## 2015-09-22 ENCOUNTER — Ambulatory Visit (HOSPITAL_COMMUNITY): Payer: 59 | Admitting: Anesthesiology

## 2015-09-22 ENCOUNTER — Encounter (HOSPITAL_COMMUNITY): Payer: Self-pay | Admitting: Anesthesiology

## 2015-09-22 ENCOUNTER — Ambulatory Visit (HOSPITAL_COMMUNITY)
Admission: RE | Admit: 2015-09-22 | Discharge: 2015-09-22 | Disposition: A | Discharge status: Home / Self Care | Payer: 59 | Source: Ambulatory Visit | Attending: Internal Medicine | Admitting: Internal Medicine

## 2015-09-22 ENCOUNTER — Encounter (HOSPITAL_COMMUNITY)
Admission: RE | Disposition: A | Discharge status: Home / Self Care | Payer: Self-pay | Source: Ambulatory Visit | Attending: Internal Medicine

## 2015-09-22 DIAGNOSIS — R911 Solitary pulmonary nodule: Secondary | ICD-10-CM | POA: Diagnosis not present

## 2015-09-22 DIAGNOSIS — Z860101 Personal history of adenomatous and serrated colon polyps: Secondary | ICD-10-CM

## 2015-09-22 DIAGNOSIS — Z7951 Long term (current) use of inhaled steroids: Secondary | ICD-10-CM | POA: Insufficient documentation

## 2015-09-22 DIAGNOSIS — K635 Polyp of colon: Secondary | ICD-10-CM | POA: Diagnosis not present

## 2015-09-22 DIAGNOSIS — D509 Iron deficiency anemia, unspecified: Secondary | ICD-10-CM | POA: Diagnosis present

## 2015-09-22 DIAGNOSIS — J45991 Cough variant asthma: Secondary | ICD-10-CM | POA: Insufficient documentation

## 2015-09-22 DIAGNOSIS — D123 Benign neoplasm of transverse colon: Secondary | ICD-10-CM | POA: Diagnosis present

## 2015-09-22 DIAGNOSIS — K219 Gastro-esophageal reflux disease without esophagitis: Secondary | ICD-10-CM | POA: Insufficient documentation

## 2015-09-22 DIAGNOSIS — D125 Benign neoplasm of sigmoid colon: Secondary | ICD-10-CM | POA: Insufficient documentation

## 2015-09-22 DIAGNOSIS — Z79899 Other long term (current) drug therapy: Secondary | ICD-10-CM | POA: Diagnosis not present

## 2015-09-22 DIAGNOSIS — Z86010 Personal history of colonic polyps: Secondary | ICD-10-CM

## 2015-09-22 DIAGNOSIS — J449 Chronic obstructive pulmonary disease, unspecified: Secondary | ICD-10-CM | POA: Diagnosis not present

## 2015-09-22 DIAGNOSIS — Z7722 Contact with and (suspected) exposure to environmental tobacco smoke (acute) (chronic): Secondary | ICD-10-CM | POA: Insufficient documentation

## 2015-09-22 HISTORY — DX: Personal history of adenomatous and serrated colon polyps: Z86.0101

## 2015-09-22 HISTORY — DX: Personal history of colonic polyps: Z86.010

## 2015-09-22 HISTORY — PX: COLONOSCOPY WITH PROPOFOL: SHX5780

## 2015-09-22 HISTORY — PX: ESOPHAGOGASTRODUODENOSCOPY (EGD) WITH PROPOFOL: SHX5813

## 2015-09-22 HISTORY — DX: Personal history of urinary calculi: Z87.442

## 2015-09-22 SURGERY — ESOPHAGOGASTRODUODENOSCOPY (EGD) WITH PROPOFOL
Anesthesia: Monitor Anesthesia Care

## 2015-09-22 MED ORDER — SODIUM CHLORIDE 0.9 % IV SOLN: INTRAVENOUS | Status: DC

## 2015-09-22 MED ORDER — PROPOFOL 10 MG/ML IV BOLUS
INTRAVENOUS | Status: AC
  Filled 2015-09-22: qty 20

## 2015-09-22 MED ORDER — PHENYLEPHRINE 40 MCG/ML (10ML) SYRINGE FOR IV PUSH (FOR BLOOD PRESSURE SUPPORT)
PREFILLED_SYRINGE | INTRAVENOUS | Status: AC
  Filled 2015-09-22: qty 10

## 2015-09-22 MED ORDER — PROPOFOL 500 MG/50ML IV EMUL
INTRAVENOUS | Status: DC | PRN
  Administered 2015-09-22: 300 ug/kg/min via INTRAVENOUS

## 2015-09-22 MED ORDER — LACTATED RINGERS IV SOLN
INTRAVENOUS | Status: DC | PRN
  Administered 2015-09-22: 11:00:00 via INTRAVENOUS

## 2015-09-22 MED ORDER — PROPOFOL 500 MG/50ML IV EMUL
INTRAVENOUS | Status: DC | PRN
  Administered 2015-09-22: 60 mg via INTRAVENOUS

## 2015-09-22 SURGICAL SUPPLY — 24 items

## 2015-09-22 NOTE — Anesthesia Preprocedure Evaluation (Signed)
Anesthesia Evaluation  Patient identified by MRN, date of birth, ID band Patient awake    Reviewed: Allergy & Precautions, NPO status , Patient's Chart, lab work & pertinent test results  Airway Mallampati: I  TM Distance: >3 FB Neck ROM: Full    Dental   Pulmonary COPD,    Pulmonary exam normal        Cardiovascular Normal cardiovascular exam     Neuro/Psych    GI/Hepatic GERD  Medicated and Controlled,  Endo/Other    Renal/GU      Musculoskeletal   Abdominal   Peds  Hematology   Anesthesia Other Findings   Reproductive/Obstetrics                             Anesthesia Physical Anesthesia Plan  ASA: II  Anesthesia Plan: MAC   Post-op Pain Management:    Induction: Intravenous  Airway Management Planned: Simple Face Mask  Additional Equipment:   Intra-op Plan:   Post-operative Plan:   Informed Consent: I have reviewed the patients History and Physical, chart, labs and discussed the procedure including the risks, benefits and alternatives for the proposed anesthesia with the patient or authorized representative who has indicated his/her understanding and acceptance.     Plan Discussed with: CRNA and Surgeon  Anesthesia Plan Comments:         Anesthesia Quick Evaluation

## 2015-09-22 NOTE — Discharge Instructions (Addendum)
° °  I found and removed two benign-appearing colon polyps. All else in colon and esophagus, stomach and duodenum ok.  No connection to the lung lesion.  Good luck with your upcoming surgery.  I will let you know pathology results and when to have another routine colonoscopy by mail.  DO NOT USE ASPIRIN, MOTRIN, IBUPROFEN, BC/GOODY POWDERS, ALEVE ETC X 2 WEEKS (UNTIL 11/30) OK TO USE ACETAMINOPHEN(TYLENOL)  I appreciate the opportunity to care for you. Gatha Mayer, MD, FACG  YOU HAD AN ENDOSCOPIC PROCEDURE TODAY: Refer to the procedure report and other information in the discharge instructions given to you for any specific questions about what was found during the examination. If this information does not answer your questions, please call Dr. Celesta Aver office at [phone] to clarify.   YOU SHOULD EXPECT: Some feelings of bloating in the abdomen. Passage of more gas than usual. Walking can help get rid of the air that was put into your GI tract during the procedure and reduce the bloating. If you had a lower endoscopy (such as a colonoscopy or flexible sigmoidoscopy) you may notice spotting of blood in your stool or on the toilet paper. Some abdominal soreness may be present for a day or two, also.  DIET: Your first meal following the procedure should be a light meal and then it is ok to progress to your normal diet. A half-sandwich or bowl of soup is an example of a good first meal. Heavy or fried foods are harder to digest and may make you feel nauseous or bloated. Drink plenty of fluids but you should avoid alcoholic beverages for 24 hours.   ACTIVITY: Your care partner should take you home directly after the procedure. You should plan to take it easy, moving slowly for the rest of the day. You can resume normal activity the day after the procedure however YOU SHOULD NOT DRIVE, use power tools, machinery or perform tasks that involve climbing or major physical exertion for 24 hours  (because of the sedation medicines used during the test).   SYMPTOMS TO REPORT IMMEDIATELY: A gastroenterologist can be reached at any hour. Please call [phone]  for any of the following symptoms:  Following lower endoscopy (colonoscopy, flexible sigmoidoscopy) Excessive amounts of blood in the stool  Significant tenderness, worsening of abdominal pains  Swelling of the abdomen that is new, acute  Fever of 100 or higher  Following upper endoscopy (EGD, EUS, ERCP, esophageal dilation) Vomiting of blood or coffee ground material  New, significant abdominal pain  New, significant chest pain or pain under the shoulder blades  Painful or persistently difficult swallowing  New shortness of breath  Black, tarry-looking or red, bloody stools  FOLLOW UP:  If any biopsies were taken you will be contacted by phone or by letter within the next 1-3 weeks. Call [phone]  if you have not heard about the biopsies in 3 weeks.  Please also call with any specific questions about appointments or follow up tests.

## 2015-09-22 NOTE — Transfer of Care (Signed)
Immediate Anesthesia Transfer of Care Note  Patient: Alinah Sheard Banet  Procedure(s) Performed: Procedure(s): ESOPHAGOGASTRODUODENOSCOPY (EGD) WITH PROPOFOL (N/A) COLONOSCOPY WITH PROPOFOL (N/A)  Patient Location: PACU and Endoscopy Unit  Anesthesia Type:MAC  Level of Consciousness: awake and patient cooperative  Airway & Oxygen Therapy: Patient Spontanous Breathing and Patient connected to nasal cannula oxygen  Post-op Assessment: Report given to RN, Post -op Vital signs reviewed and stable and Patient moving all extremities X 4  Post vital signs: Reviewed and stable  Last Vitals:  Filed Vitals:   09/22/15 1108  BP: 134/76  Pulse: 69  Temp: 36.8 C  Resp: 14    Complications: No apparent anesthesia complications

## 2015-09-22 NOTE — Interval H&P Note (Signed)
History and Physical Interval Note:  09/22/2015 11:08 AM  Anna Larson  has presented today for surgery, with the diagnosis of Iron deficiency anemia  The various methods of treatment have been discussed with the patient and family. After consideration of risks, benefits and other options for treatment, the patient has consented to  Procedure(s): ESOPHAGOGASTRODUODENOSCOPY (EGD) WITH PROPOFOL (N/A) COLONOSCOPY WITH PROPOFOL (N/A) as a surgical intervention .  [name] history has been reviewed, patient examined, no change in status, stable for surgery.  I have reviewed the patient's chart and labs.  Questions were answered to the patient's satisfaction.     Silvano Rusk

## 2015-09-22 NOTE — Anesthesia Postprocedure Evaluation (Signed)
Anesthesia Post Note  Patient: Anna Larson  Procedure(s) Performed: Procedure(s) (LRB): ESOPHAGOGASTRODUODENOSCOPY (EGD) WITH PROPOFOL (N/A) COLONOSCOPY WITH PROPOFOL (N/A)  Anesthesia type: MAC  Patient location: PACU  Post pain: Pain level controlled  Post assessment: Patient's Cardiovascular Status Stable  Last Vitals:  Filed Vitals:   09/22/15 1250  BP: 129/90  Pulse:   Temp:   Resp:     Post vital signs: Reviewed and stable  Level of consciousness: sedated  Complications: No apparent anesthesia complications

## 2015-09-22 NOTE — Op Note (Addendum)
[facility] [place], [address]   COLONOSCOPY PROCEDURE REPORT  PATIENT: Maleaha, Hughett  MR#: [id number] BIRTHDATE: 08-10-1966 , 49  yrs. old GENDER: female ENDOSCOPIST: Gatha Mayer, MD, [facility] PROCEDURE DATE:  09/22/2015 PROCEDURE:   Colonoscopy, diagnostic and Colonoscopy with snare polypectomy First Screening Colonoscopy - Avg.  risk and is 50 yrs.  old or older - No.  Prior Negative Screening - Now for repeat screening. N/A  History of Adenoma - Now for follow-up colonoscopy & has been > or = to 3 yrs.  N/A  Polyps removed today? Yes ASA CLASS:   Class II INDICATIONS:Unexplained iron deficiency anemia and Patient is not applicable for Colorectal Neoplasm Risk Assessment for this procedure. MEDICATIONS: Monitored anesthesia care and Per Anesthesia  DESCRIPTION OF PROCEDURE:   After the risks benefits and alternatives of the procedure were thoroughly explained, informed consent was obtained.  The digital rectal exam revealed no abnormalities of the rectum.   The EC-[id number] ([id number])  endoscope was introduced through the anus and advanced to the cecum, which was identified by both the appendix and ileocecal valve. No adverse events experienced.   The quality of the prep was excellent. (MiraLax was used)  The instrument was then slowly withdrawn as the colon was fully examined. Estimated blood loss is zero unless otherwise noted in this procedure report.  COLON FINDINGS: Two polypoid shaped polyps ranging from 7 to 69m in size were found in the sigmoid colon and transverse colon. Polypectomies were performed using snare cautery.  The resection was complete, the polyp tissue was completely retrieved and sent to histology.   The examination was otherwise normal.  Retroflexed views revealed no abnormalities. The time to cecum = 2.6 Withdrawal time = 12.7   The scope was withdrawn and the procedure completed. COMPLICATIONS: There were no immediate  complications.  ENDOSCOPIC IMPRESSION: 1.   Two polyps ranging from 7 to 150min size were found in the sigmoid colon and transverse colon; polypectomies were performed using snare cautery 2.   The examination was otherwise normal - excellent prep  RECOMMENDATIONS: 1.  Timing of repeat colonoscopy will be determined by pathology findings. 2.  EGD next 3.  Hold Aspirin and all other NSAIDS for 2 weeks.  11/30 - PLACED IN PATIENT INSTRUCTIONS  eSigned:  CaGatha MayerMD, FAGreystone Park Psychiatric Hospital1/15/2016 12:32 PM Revised: 09/22/2015 12:32 PM  cc: The Patient, Dr, NeTera Partridgend Dr. StMerilynn Finland

## 2015-09-22 NOTE — Op Note (Signed)
[facility] [place], [address]   ENDOSCOPY PROCEDURE REPORT  PATIENT: Anna, Larson  MR#: [id number] BIRTHDATE: Dec 26, 1965 , 49  yrs. old GENDER: female ENDOSCOPIST: Gatha Mayer, MD, [facility] PROCEDURE DATE:  09/22/2015 PROCEDURE:  EGD, diagnostic ASA CLASS:     Class II INDICATIONS:  iron deficiency anemia. MEDICATIONS: Monitored anesthesia care and Per Anesthesia TOPICAL ANESTHETIC: none  DESCRIPTION OF PROCEDURE: After the risks benefits and alternatives of the procedure were thoroughly explained, informed consent was obtained.  The Pentax Gastroscope [id number] endoscope was introduced through the mouth and advanced to the second portion of the duodenum , Without limitations.  The instrument was slowly withdrawn as the mucosa was fully examined.      EXAM: The esophagus and gastroesophageal junction were completely normal in appearance.  The stomach was entered and closely examined.The antrum, angularis, and lesser curvature were well visualized, including a retroflexed view of the cardia and fundus. The stomach wall was normally distensable.  The scope passed easily through the pylorus into the duodenum.  Retroflexed views revealed no abnormalities.     The scope was then withdrawn from the patient and the procedure completed.  COMPLICATIONS: There were no immediate complications.  ENDOSCOPIC IMPRESSION: Normal appearing esophagus and GE junction, the stomach was well visualized and normal in appearance, normal appearing duodenum  RECOMMENDATIONS: Continue iron supplements No further GI work-up at this time to have lung nodule resected in early December    eSigned:  Gatha Mayer, MD, [facility] 09/22/2015 12:23 PM    CC:The Patient Dr. Tera Partridge and Dr. Merilynn Finland

## 2015-09-22 NOTE — H&P (View-Only) (Signed)
Patient ID: Anna Larson, female   DOB: 06/08/1966, 49 y.o.   MRN: [id number]   Subjective:    Patient ID: Anna Larson, female    DOB: [birth year], 49 y.o.   MRN: [id number]  HPI Devann is a pleasant 49 year old white female new to GI referred by Dr. Ashok Cordia for evaluation of new iron deficiency anemia. Patient has been undergoing pulmonary evaluation for a right upper lobe lung nodule. She had recent CT done on 08/31/2015 which showed a persistent right upper lobe lesion concerning for low-grade neoplasm. She is in the process of being referred to a surgeon for partial lobectomy. Recent labs were done showing a hemoglobin of 8.1 hematocrit of 28.8 MCV of 55 platelets 186, ferritin 2.1 serum iron less than 10 TIBC 443 9 saturation of 1. Patient states that she was told that she was anemic a few years ago and was seen by a hematologist in [facility] though she cannot recall the name. She was placed on oral iron but did not undergo any other evaluation. She has not had transfusion requirement and has not had intravenous iron infusion. She says she has been fatigued for some  Time.and does have some exertional dyspnea at times. She is still menstruating and usually has menses lasting 5-6 days she has not noticed any increase in bleeding etc. over the past couple of years. She did have recent GYN evaluation by Halford Chessman in [facility]. Patient has no complaints of abdominal pain and changes in bowel habits melena or hematochezia. No complaint of heartburn or indigestion on a regular basis. No dysphagia. There is no family history of colon cancers or celiac disease.  Review of Systems Pertinent positive and negative review of systems were noted in the above HPI section.  All other review of systems was otherwise negative.  Outpatient Encounter Prescriptions as of 09/08/2015  Medication Sig  . budesonide-formoterol (SYMBICORT) 80-4.5 MCG/ACT inhaler Inhale 2 puffs into the lungs 2 (two) times daily.  .  chlorpheniramine (CHLOR-TRIMETON) 4 MG tablet Take 3 tablets (12 mg total) by mouth daily.  . ferrous sulfate 325 (65 FE) MG tablet Take 1 tablet (325 mg total) by mouth 3 (three) times daily with meals.  . [DISCONTINUED] albuterol (PROVENTIL HFA;VENTOLIN HFA) 108 (90 BASE) MCG/ACT inhaler Inhale 2 puffs into the lungs every 6 (six) hours as needed for wheezing.  . [DISCONTINUED] benzonatate (TESSALON) 100 MG capsule Take 1-2 every 4 hours as needed for cough  . [DISCONTINUED] diclofenac (VOLTAREN) 75 MG EC tablet Take 1 tablet by mouth 2 (two) times daily as needed.    No facility-administered encounter medications on file as of 09/08/2015.   Allergies  Allergen Reactions  . Codeine    Patient Active Problem List   Diagnosis Date Noted  . COPD (chronic obstructive pulmonary disease) ([place]) 08/26/2015  . Allergic rhinitis 08/26/2015  . GERD (gastroesophageal reflux disease) 08/26/2015  . Lung nodule seen on imaging study   . Cough variant asthma    Social History   Social History  . Marital Status: Married    Spouse Name: N/A  . Number of Children: 2  . Years of Education: N/A   Occupational History  . [place] History Main Topics  . Smoking status: Passive Smoke Exposure - Never Smoker  . Smokeless tobacco: Never Used     Comment: both mothers smoked  . Alcohol Use: 0.0 oz/week    0 Standard drinks or equivalent per week  Comment: occasional  . Drug Use: No  . Sexual Activity: Not on file   Other Topics Concern  . Not on file   Social History Narrative   Originally from [place]. Has always lived in [place]. Has traveled to [facility]. No international travel. Works in the [employer] system in [employer]. Has an outdoor dog. No bird, mold, or hot tube exposure. Enjoys cross stitching.     Ms. Overbaugh's family history includes Bone cancer in her maternal grandmother; Emphysema in her maternal grandfather; GER disease in her father; Multiple  sclerosis in her mother; Prostate cancer in her paternal grandfather.      Objective:    Filed Vitals:   09/08/15 0857  BP: 128/86  Pulse: 88    Physical Exam  well-developed white female in no acute distress, pleasant blood pressure 128/86 pulse 88 height 5 foot 7 weight 253, BMI 39.6. HEENT; nontraumatic normocephalic EOMI PERRLA sclera anicteric, Cardiovascular; regular rate and rhythm with S1-S2 no murmur or gallop, Pulmonary; clear bilaterally, Abdomen ;soft nontender nondistended bowel sounds are active there is no palpable mass or hepatosplenomegaly up rectal exam not done, Extremities; no clubbing cyanosis or edema skin warm and dry, Neuropsych; mood and affect appropriate       Assessment & Plan:   #1 49 yo female with new dx of iron deficiency anemia- R/O chronic GI blood loss,R/O malabsorption, no menorrhagia by hx. #2 RUL lung  Nodule/lesion- workup in progress- to be referred for surgery #3 COPD #4 GERD  Plan; Patient will continue ferrous sulfate 325 mg by mouth 3 times a day Repeat CBC today, check  hemosure  and TTG and IgA Schedule for colonoscopy and EGD with Dr. Carlean Purl. Procedures discussed in detail with patient and she is agreeable to proceed.   Jerrie Gullo S Nathaneil Feagans PA-C 09/08/2015   Cc: No ref. provider found

## 2015-09-23 ENCOUNTER — Encounter (HOSPITAL_COMMUNITY): Payer: Self-pay | Admitting: Internal Medicine

## 2015-09-28 ENCOUNTER — Encounter (HOSPITAL_COMMUNITY): Payer: Self-pay | Admitting: Internal Medicine

## 2015-09-28 ENCOUNTER — Encounter: Payer: Self-pay | Admitting: Internal Medicine

## 2015-09-28 NOTE — Progress Notes (Signed)
Quick Note:  ssp and adenoma max 10 mm - repeat colonoscopy 2019 ______

## 2015-10-08 ENCOUNTER — Other Ambulatory Visit: Payer: Self-pay

## 2015-10-08 ENCOUNTER — Encounter (HOSPITAL_COMMUNITY): Payer: Self-pay

## 2015-10-08 ENCOUNTER — Encounter (HOSPITAL_COMMUNITY)
Admission: RE | Admit: 2015-10-08 | Discharge: 2015-10-08 | Disposition: A | Discharge status: Home / Self Care | Payer: PRIVATE HEALTH INSURANCE | Source: Ambulatory Visit | Attending: Thoracic Surgery (Cardiothoracic Vascular Surgery) | Admitting: Thoracic Surgery (Cardiothoracic Vascular Surgery)

## 2015-10-08 VITALS — BP 119/74 | HR 82 | Temp 98.1°F | Resp 18 | Ht 69.0 in | Wt 258.6 lb

## 2015-10-08 DIAGNOSIS — Z01812 Encounter for preprocedural laboratory examination: Secondary | ICD-10-CM | POA: Diagnosis not present

## 2015-10-08 DIAGNOSIS — R911 Solitary pulmonary nodule: Secondary | ICD-10-CM

## 2015-10-08 HISTORY — DX: Pneumonia, unspecified organism: J18.9

## 2015-10-08 HISTORY — DX: Anemia, unspecified: D64.9

## 2015-10-08 LAB — COMPREHENSIVE METABOLIC PANEL
ALK PHOS: 90 U/L (ref 38–126)
ALT: 21 U/L (ref 14–54)
AST: 18 U/L (ref 15–41)
Albumin: 3.7 g/dL (ref 3.5–5.0)
Anion gap: 9 (ref 5–15)
BUN: 5 mg/dL — AB (ref 6–20)
CALCIUM: 8.9 mg/dL (ref 8.9–10.3)
CO2: 19 mmol/L — AB (ref 22–32)
CREATININE: 0.47 mg/dL (ref 0.44–1.00)
Chloride: 109 mmol/L (ref 101–111)
GFR calc non Af Amer: >60 mL/min (ref >60)
GLUCOSE: 130 mg/dL — AB (ref 65–99)
Potassium: 4 mmol/L (ref 3.5–5.1)
SODIUM: 137 mmol/L (ref 135–145)
Total Bilirubin: 0.5 mg/dL (ref 0.3–1.2)
Total Protein: 6.1 g/dL — ABNORMAL LOW (ref 6.5–8.1)

## 2015-10-08 LAB — BLOOD GAS, ARTERIAL
Acid-base deficit: 3.1 mmol/L — ABNORMAL HIGH (ref 0.0–2.0)
Bicarbonate: 20.6 mEq/L (ref 20.0–24.0)
DRAWN BY: 206361
FIO2: 0.21
O2 SAT: 96.3 %
PCO2 ART: 32.2 mmHg — AB (ref 35.0–45.0)
PO2 ART: 82.5 mmHg (ref 80.0–100.0)
Patient temperature: 98.6
TCO2: 21.6 mmol/L (ref 0–100)
pH, Arterial: 7.422 (ref 7.350–7.450)

## 2015-10-08 LAB — PROTIME-INR
INR: 1.05 (ref 0.00–1.49)
Prothrombin Time: 13.9 seconds (ref 11.6–15.2)

## 2015-10-08 LAB — SURGICAL PCR SCREEN
MRSA, PCR: POSITIVE — AB
STAPHYLOCOCCUS AUREUS: POSITIVE — AB

## 2015-10-08 LAB — CBC
HCT: 39.7 % (ref 36.0–46.0)
HEMOGLOBIN: 11.5 g/dL — AB (ref 12.0–15.0)
MCH: 19.9 pg — AB (ref 26.0–34.0)
MCHC: 29 g/dL — ABNORMAL LOW (ref 30.0–36.0)
MCV: 68.8 fL — AB (ref 78.0–100.0)
Platelets: 198 10*3/uL (ref 150–400)
RBC: 5.77 MIL/uL — AB (ref 3.87–5.11)
RDW: 27 % — ABNORMAL HIGH (ref 11.5–15.5)
WBC: 5.7 10*3/uL (ref 4.0–10.5)

## 2015-10-08 LAB — URINALYSIS, ROUTINE W REFLEX MICROSCOPIC
BILIRUBIN URINE: NEGATIVE
GLUCOSE, UA: NEGATIVE mg/dL
HGB URINE DIPSTICK: NEGATIVE
KETONES UR: NEGATIVE mg/dL
LEUKOCYTES UA: NEGATIVE
NITRITE: NEGATIVE
PH: 7 (ref 5.0–8.0)
PROTEIN: NEGATIVE mg/dL
Specific Gravity, Urine: 1.01 (ref 1.005–1.030)

## 2015-10-08 LAB — HCG, SERUM, QUALITATIVE: Preg, Serum: NEGATIVE

## 2015-10-08 LAB — TYPE AND SCREEN
ABO/RH(D): AB POS
Antibody Screen: NEGATIVE

## 2015-10-08 LAB — APTT: APTT: 30 s (ref 24–37)

## 2015-10-08 LAB — ABO/RH: ABO/RH(D): AB POS

## 2015-10-08 NOTE — Progress Notes (Signed)
Nurse called Mupirocin ointment into [facility] and then attempted to call patient. No answer from patient. Nurse left a voicemail informing patient to return call at earliest convenience.   Patient returned call at [phone] and Nurse informed patient of positive PCR results, and instructed patient to pick up prescription and began treatment at her earliest convenience. Patient verbalized understanding.

## 2015-10-08 NOTE — Progress Notes (Signed)
Patient denied having a PCP  Patient also denied having any current shortness of breath, but stated that she uses Symbicort inhaler as ordered.   Pulmonologist is [facility]

## 2015-10-08 NOTE — Pre-Procedure Instructions (Signed)
Anna Larson  10/08/2015     Your procedure is scheduled on : Monday October 12, 2015 at 7:30 AM.  Report to [facility] Admitting at 5:30 AM.  Call this number if you have problems the morning of surgery: [phone]    Remember:  Do not eat food or drink liquids after midnight.  Take these medicines the morning of surgery with A SIP OF WATER : Symbicort inhaler   Stop taking any vitamins, herbal medications, Ibuprofen, Advil, Motrin, Aleve, etc   Do not wear jewelry, make-up or nail polish.  Do not wear lotions, powders, or perfumes.  You may NOT wear deodorant.  Do not shave 48 hours prior to surgery.    Do not bring valuables to the hospital.  [facility] is not responsible for any belongings or valuables.  Contacts, dentures or bridgework may not be worn into surgery.  Leave your suitcase in the car.  After surgery it may be brought to your room.  For patients admitted to the hospital, discharge time will be determined by your treatment team.  Patients discharged the day of surgery will not be allowed to drive home.   Name and phone number of your driver:    Special instructions:  Shower using CHG soap the night before and the morning of your surgery  Please read over the following fact sheets that you were given. Pain Booklet, Coughing and Deep Breathing, Blood Transfusion Information, MRSA Information and Surgical Site Infection Prevention

## 2015-10-09 NOTE — Progress Notes (Signed)
Anesthesia Chart Review: Patient is a 49 year old female scheduled for right VATS, segmentectomy or RLL, possible lobectomy on 10/12/15 by Dr. Roxan Hockey. DX: RUL nodule (hypermetabolic on PET CT; consider carcinoid tumor versus adenocarcinoma. Surgery needed for definitive diagnosis.  History includes passive smoking exposure, asthma, COPD, GERD, anemia. BMI is consistent with obesity. Pulmonologist is Dr. Tera Partridge. She does not have a current PCP.   Meds include Symbicort, Chlor-trimeton, 65 Fe.  10/08/15 EKG: NSR, low voltage.  08/26/15 PFTs:  Pulmonary function testing FVC 3.09 (78%) FEV1 2.07 (66%) FEV1 postbronchodilator 1.05 (33%) ?! DLCO 69%  Preoperative labs noted. Serum pregnancy is negative.   If no acute changes then I would anticipate that she could proceed as planned. She is for a CXR on the day of surgery.  George Hugh [facility] Short Stay Center/Anesthesiology Phone [phone] 10/09/2015 9:35 AM

## 2015-10-11 ENCOUNTER — Encounter (HOSPITAL_COMMUNITY): Payer: Self-pay | Admitting: Certified Registered Nurse Anesthetist

## 2015-10-11 MED ORDER — DEXTROSE 5 % IV SOLN
1.5000 g | INTRAVENOUS | Status: AC
  Administered 2015-10-12: 1.5 g via INTRAVENOUS
  Filled 2015-10-11: qty 1.5

## 2015-10-12 ENCOUNTER — Encounter (HOSPITAL_COMMUNITY)
Admission: RE | Disposition: A | Discharge status: Home / Self Care | Payer: Self-pay | Source: Ambulatory Visit | Attending: Thoracic Surgery (Cardiothoracic Vascular Surgery)

## 2015-10-12 ENCOUNTER — Inpatient Hospital Stay (HOSPITAL_COMMUNITY): Payer: 59 | Admitting: Vascular Surgery

## 2015-10-12 ENCOUNTER — Inpatient Hospital Stay (HOSPITAL_COMMUNITY): Payer: 59

## 2015-10-12 ENCOUNTER — Inpatient Hospital Stay (HOSPITAL_COMMUNITY): Payer: 59 | Admitting: Certified Registered Nurse Anesthetist

## 2015-10-12 ENCOUNTER — Inpatient Hospital Stay (HOSPITAL_COMMUNITY)
Admission: RE | Admit: 2015-10-12 | Discharge: 2015-10-15 | DRG: 164 | Disposition: A | Discharge status: Home / Self Care | Payer: 59 | Source: Ambulatory Visit | Attending: Thoracic Surgery (Cardiothoracic Vascular Surgery) | Admitting: Thoracic Surgery (Cardiothoracic Vascular Surgery)

## 2015-10-12 DIAGNOSIS — Z7951 Long term (current) use of inhaled steroids: Secondary | ICD-10-CM

## 2015-10-12 DIAGNOSIS — Z885 Allergy status to narcotic agent status: Secondary | ICD-10-CM

## 2015-10-12 DIAGNOSIS — Z79899 Other long term (current) drug therapy: Secondary | ICD-10-CM | POA: Diagnosis not present

## 2015-10-12 DIAGNOSIS — C3411 Malignant neoplasm of upper lobe, right bronchus or lung: Principal | ICD-10-CM | POA: Diagnosis present

## 2015-10-12 DIAGNOSIS — Z7722 Contact with and (suspected) exposure to environmental tobacco smoke (acute) (chronic): Secondary | ICD-10-CM | POA: Diagnosis present

## 2015-10-12 DIAGNOSIS — D1800 Hemangioma unspecified site: Secondary | ICD-10-CM | POA: Diagnosis present

## 2015-10-12 DIAGNOSIS — J45909 Unspecified asthma, uncomplicated: Secondary | ICD-10-CM | POA: Diagnosis present

## 2015-10-12 DIAGNOSIS — Z808 Family history of malignant neoplasm of other organs or systems: Secondary | ICD-10-CM

## 2015-10-12 DIAGNOSIS — Z6838 Body mass index (BMI) 38.0-38.9, adult: Secondary | ICD-10-CM | POA: Diagnosis not present

## 2015-10-12 DIAGNOSIS — Z6841 Body Mass Index (BMI) 40.0 and over, adult: Secondary | ICD-10-CM | POA: Diagnosis not present

## 2015-10-12 DIAGNOSIS — C3491 Malignant neoplasm of unspecified part of right bronchus or lung: Secondary | ICD-10-CM

## 2015-10-12 DIAGNOSIS — K219 Gastro-esophageal reflux disease without esophagitis: Secondary | ICD-10-CM | POA: Diagnosis present

## 2015-10-12 DIAGNOSIS — D3A8 Other benign neuroendocrine tumors: Secondary | ICD-10-CM | POA: Diagnosis present

## 2015-10-12 DIAGNOSIS — J449 Chronic obstructive pulmonary disease, unspecified: Secondary | ICD-10-CM | POA: Diagnosis present

## 2015-10-12 DIAGNOSIS — R911 Solitary pulmonary nodule: Secondary | ICD-10-CM

## 2015-10-12 DIAGNOSIS — J939 Pneumothorax, unspecified: Secondary | ICD-10-CM | POA: Diagnosis not present

## 2015-10-12 DIAGNOSIS — Z4682 Encounter for fitting and adjustment of non-vascular catheter: Secondary | ICD-10-CM

## 2015-10-12 DIAGNOSIS — C7A090 Malignant carcinoid tumor of the bronchus and lung: Secondary | ICD-10-CM | POA: Diagnosis present

## 2015-10-12 HISTORY — PX: VIDEO ASSISTED THORACOSCOPY: SHX5073

## 2015-10-12 HISTORY — DX: Malignant carcinoid tumor of the bronchus and lung: C7A.090

## 2015-10-12 HISTORY — PX: SEGMENTECOMY: SHX5076

## 2015-10-12 SURGERY — VIDEO ASSISTED THORACOSCOPY
Anesthesia: General | Site: Chest | Laterality: Right

## 2015-10-12 MED ORDER — DIPHENHYDRAMINE HCL 50 MG/ML IJ SOLN
12.5000 mg | Freq: Four times a day (QID) | INTRAMUSCULAR | Status: DC | PRN
  Administered 2015-10-13: 12.5 mg via INTRAVENOUS
  Filled 2015-10-12: qty 1

## 2015-10-12 MED ORDER — FENTANYL CITRATE (PF) 100 MCG/2ML IJ SOLN
INTRAMUSCULAR | Status: DC | PRN
  Administered 2015-10-12: 150 ug via INTRAVENOUS
  Administered 2015-10-12 (×12): 50 ug via INTRAVENOUS

## 2015-10-12 MED ORDER — BUPIVACAINE HCL (PF) 0.5 % IJ SOLN
INTRAMUSCULAR | Status: DC | PRN
  Administered 2015-10-12: 10 mL

## 2015-10-12 MED ORDER — PROPOFOL 10 MG/ML IV BOLUS
INTRAVENOUS | Status: AC
  Filled 2015-10-12: qty 20

## 2015-10-12 MED ORDER — ONDANSETRON HCL 4 MG/2ML IJ SOLN: 4.0000 mg | Freq: Four times a day (QID) | INTRAMUSCULAR | Status: DC | PRN

## 2015-10-12 MED ORDER — KETOROLAC TROMETHAMINE 30 MG/ML IJ SOLN
30.0000 mg | Freq: Four times a day (QID) | INTRAMUSCULAR | Status: AC | PRN
  Administered 2015-10-12 – 2015-10-14 (×4): 30 mg via INTRAVENOUS
  Filled 2015-10-12 (×4): qty 1

## 2015-10-12 MED ORDER — SUGAMMADEX SODIUM 200 MG/2ML IV SOLN
INTRAVENOUS | Status: DC | PRN
  Administered 2015-10-12: 200 mg via INTRAVENOUS

## 2015-10-12 MED ORDER — FENTANYL CITRATE (PF) 250 MCG/5ML IJ SOLN
INTRAMUSCULAR | Status: AC
  Filled 2015-10-12: qty 5

## 2015-10-12 MED ORDER — OXYCODONE HCL 5 MG PO TABS
ORAL_TABLET | ORAL | Status: AC
  Filled 2015-10-12: qty 2

## 2015-10-12 MED ORDER — DIPHENHYDRAMINE HCL 50 MG/ML IJ SOLN: 12.5000 mg | Freq: Four times a day (QID) | INTRAMUSCULAR | Status: DC | PRN

## 2015-10-12 MED ORDER — LIDOCAINE HCL (CARDIAC) 20 MG/ML IV SOLN
INTRAVENOUS | Status: AC
  Filled 2015-10-12: qty 5

## 2015-10-12 MED ORDER — SUCCINYLCHOLINE CHLORIDE 20 MG/ML IJ SOLN
INTRAMUSCULAR | Status: AC
  Filled 2015-10-12: qty 1

## 2015-10-12 MED ORDER — ROCURONIUM BROMIDE 100 MG/10ML IV SOLN
INTRAVENOUS | Status: DC | PRN
  Administered 2015-10-12: 50 mg via INTRAVENOUS
  Administered 2015-10-12 (×4): 10 mg via INTRAVENOUS

## 2015-10-12 MED ORDER — SENNOSIDES-DOCUSATE SODIUM 8.6-50 MG PO TABS
1.0000 | ORAL_TABLET | Freq: Every day | ORAL | Status: DC
  Administered 2015-10-12 – 2015-10-13 (×2): 1 via ORAL
  Filled 2015-10-12 (×2): qty 1

## 2015-10-12 MED ORDER — HYDROMORPHONE HCL 1 MG/ML IJ SOLN
INTRAMUSCULAR | Status: AC
  Administered 2015-10-12: 0.5 mg via INTRAVENOUS
  Filled 2015-10-12: qty 1

## 2015-10-12 MED ORDER — MEPERIDINE HCL 25 MG/ML IJ SOLN: 6.2500 mg | INTRAMUSCULAR | Status: DC | PRN

## 2015-10-12 MED ORDER — LIDOCAINE HCL (CARDIAC) 20 MG/ML IV SOLN
INTRAVENOUS | Status: DC | PRN
  Administered 2015-10-12: 100 mg via INTRAVENOUS

## 2015-10-12 MED ORDER — NALOXONE HCL 0.4 MG/ML IJ SOLN: 0.4000 mg | INTRAMUSCULAR | Status: DC | PRN

## 2015-10-12 MED ORDER — MIDAZOLAM HCL 5 MG/5ML IJ SOLN
INTRAMUSCULAR | Status: DC | PRN
  Administered 2015-10-12: 2 mg via INTRAVENOUS

## 2015-10-12 MED ORDER — BUPIVACAINE 0.5 % ON-Q PUMP SINGLE CATH 400 ML
INJECTION | Status: DC | PRN
  Administered 2015-10-12: 400 mL

## 2015-10-12 MED ORDER — EPHEDRINE SULFATE 50 MG/ML IJ SOLN
INTRAMUSCULAR | Status: AC
  Filled 2015-10-12: qty 1

## 2015-10-12 MED ORDER — SUGAMMADEX SODIUM 200 MG/2ML IV SOLN
INTRAVENOUS | Status: AC
  Filled 2015-10-12: qty 2

## 2015-10-12 MED ORDER — SODIUM CHLORIDE 0.9 % IJ SOLN: 9.0000 mL | INTRAMUSCULAR | Status: DC | PRN

## 2015-10-12 MED ORDER — LACTATED RINGERS IV SOLN
INTRAVENOUS | Status: DC | PRN
  Administered 2015-10-12 (×2): via INTRAVENOUS

## 2015-10-12 MED ORDER — HYDROMORPHONE HCL 1 MG/ML IJ SOLN
0.2500 mg | INTRAMUSCULAR | Status: DC | PRN
  Administered 2015-10-12 (×2): 0.5 mg via INTRAVENOUS

## 2015-10-12 MED ORDER — DIPHENHYDRAMINE HCL 12.5 MG/5ML PO ELIX: 12.5000 mg | ORAL_SOLUTION | Freq: Four times a day (QID) | ORAL | Status: DC | PRN

## 2015-10-12 MED ORDER — BUPIVACAINE HCL (PF) 0.5 % IJ SOLN
INTRAMUSCULAR | Status: AC
  Filled 2015-10-12: qty 10

## 2015-10-12 MED ORDER — FENTANYL 40 MCG/ML IV SOLN
INTRAVENOUS | Status: DC
  Administered 2015-10-12: 12:00:00 via INTRAVENOUS
  Filled 2015-10-12: qty 25

## 2015-10-12 MED ORDER — PROPOFOL 10 MG/ML IV BOLUS
INTRAVENOUS | Status: DC | PRN
  Administered 2015-10-12: 200 mg via INTRAVENOUS

## 2015-10-12 MED ORDER — FENTANYL 40 MCG/ML IV SOLN
INTRAVENOUS | Status: DC
  Administered 2015-10-12: 380 ug via INTRAVENOUS
  Administered 2015-10-12: 195 ug via INTRAVENOUS
  Administered 2015-10-13: 180 ug via INTRAVENOUS
  Administered 2015-10-13 (×2): 30 ug via INTRAVENOUS
  Administered 2015-10-13 (×2): 105 ug via INTRAVENOUS
  Administered 2015-10-13: 120 ug via INTRAVENOUS
  Administered 2015-10-13: 60 ug via INTRAVENOUS
  Administered 2015-10-14: 15 ug via INTRAVENOUS
  Administered 2015-10-14 (×2): 30 ug via INTRAVENOUS
  Filled 2015-10-12 (×2): qty 25

## 2015-10-12 MED ORDER — LACTATED RINGERS IV SOLN
INTRAVENOUS | Status: DC | PRN
  Administered 2015-10-12: 07:00:00 via INTRAVENOUS

## 2015-10-12 MED ORDER — ACETAMINOPHEN 500 MG PO TABS
1000.0000 mg | ORAL_TABLET | Freq: Four times a day (QID) | ORAL | Status: DC
  Administered 2015-10-12 – 2015-10-15 (×10): 1000 mg via ORAL
  Filled 2015-10-12 (×10): qty 2

## 2015-10-12 MED ORDER — MIDAZOLAM HCL 2 MG/2ML IJ SOLN
INTRAMUSCULAR | Status: AC
  Filled 2015-10-12: qty 2

## 2015-10-12 MED ORDER — ONDANSETRON HCL 4 MG/2ML IJ SOLN
INTRAMUSCULAR | Status: DC | PRN
  Administered 2015-10-12: 4 mg via INTRAVENOUS

## 2015-10-12 MED ORDER — BUPIVACAINE 0.5 % ON-Q PUMP SINGLE CATH 400 ML
400.0000 mL | INJECTION | Status: DC
  Filled 2015-10-12: qty 400

## 2015-10-12 MED ORDER — BISACODYL 5 MG PO TBEC
10.0000 mg | DELAYED_RELEASE_TABLET | Freq: Every day | ORAL | Status: DC
  Administered 2015-10-12 – 2015-10-13 (×2): 10 mg via ORAL
  Filled 2015-10-12 (×3): qty 2

## 2015-10-12 MED ORDER — ACETAMINOPHEN 160 MG/5ML PO SOLN: 1000.0000 mg | Freq: Four times a day (QID) | ORAL | Status: DC

## 2015-10-12 MED ORDER — ONDANSETRON HCL 4 MG/2ML IJ SOLN: 4.0000 mg | Freq: Once | INTRAMUSCULAR | Status: DC | PRN

## 2015-10-12 MED ORDER — DIPHENHYDRAMINE HCL 12.5 MG/5ML PO ELIX
12.5000 mg | ORAL_SOLUTION | Freq: Four times a day (QID) | ORAL | Status: DC | PRN
  Administered 2015-10-14: 12.5 mg via ORAL
  Filled 2015-10-12: qty 5

## 2015-10-12 MED ORDER — KCL IN DEXTROSE-NACL 20-5-0.45 MEQ/L-%-% IV SOLN
INTRAVENOUS | Status: DC
  Administered 2015-10-12 – 2015-10-13 (×3): via INTRAVENOUS
  Filled 2015-10-12 (×3): qty 1000

## 2015-10-12 MED ORDER — BUDESONIDE-FORMOTEROL FUMARATE 80-4.5 MCG/ACT IN AERO
2.0000 | INHALATION_SPRAY | Freq: Two times a day (BID) | RESPIRATORY_TRACT | Status: DC
  Administered 2015-10-12 – 2015-10-15 (×5): 2 via RESPIRATORY_TRACT
  Filled 2015-10-12: qty 6.9

## 2015-10-12 MED ORDER — BUPIVACAINE HCL (PF) 0.5 % IJ SOLN
INTRAMUSCULAR | Status: AC
  Filled 2015-10-12: qty 30

## 2015-10-12 MED ORDER — TRAMADOL HCL 50 MG PO TABS: 50.0000 mg | ORAL_TABLET | Freq: Four times a day (QID) | ORAL | Status: DC | PRN

## 2015-10-12 MED ORDER — BUPIVACAINE ON-Q PAIN PUMP (FOR ORDER SET NO CHG)
INJECTION | Status: DC
  Filled 2015-10-12: qty 1

## 2015-10-12 MED ORDER — ROCURONIUM BROMIDE 50 MG/5ML IV SOLN
INTRAVENOUS | Status: AC
  Filled 2015-10-12: qty 1

## 2015-10-12 MED ORDER — 0.9 % SODIUM CHLORIDE (POUR BTL) OPTIME
TOPICAL | Status: DC | PRN
Start: 2015-10-12 — End: 2015-10-12
  Administered 2015-10-12: 2000 mL

## 2015-10-12 MED ORDER — ONDANSETRON HCL 4 MG/2ML IJ SOLN
INTRAMUSCULAR | Status: AC
  Filled 2015-10-12: qty 2

## 2015-10-12 MED ORDER — DEXTROSE 5 % IV SOLN
1.5000 g | Freq: Two times a day (BID) | INTRAVENOUS | Status: AC
  Administered 2015-10-12: 1.5 g via INTRAVENOUS
  Filled 2015-10-12 (×3): qty 1.5

## 2015-10-12 MED ORDER — SODIUM CHLORIDE 0.9 % IJ SOLN
INTRAMUSCULAR | Status: AC
  Filled 2015-10-12: qty 10

## 2015-10-12 MED ORDER — OXYCODONE HCL 5 MG PO TABS
5.0000 mg | ORAL_TABLET | ORAL | Status: DC | PRN
  Administered 2015-10-12 – 2015-10-14 (×7): 10 mg via ORAL
  Filled 2015-10-12 (×6): qty 2

## 2015-10-12 MED ORDER — PHENYLEPHRINE 40 MCG/ML (10ML) SYRINGE FOR IV PUSH (FOR BLOOD PRESSURE SUPPORT)
PREFILLED_SYRINGE | INTRAVENOUS | Status: AC
  Filled 2015-10-12: qty 10

## 2015-10-12 MED ORDER — FERROUS SULFATE 325 (65 FE) MG PO TABS
325.0000 mg | ORAL_TABLET | Freq: Three times a day (TID) | ORAL | Status: DC
  Administered 2015-10-12 – 2015-10-15 (×8): 325 mg via ORAL
  Filled 2015-10-12 (×9): qty 1

## 2015-10-12 MED ORDER — POTASSIUM CHLORIDE 10 MEQ/50ML IV SOLN: 10.0000 meq | Freq: Every day | INTRAVENOUS | Status: DC | PRN

## 2015-10-12 SURGICAL SUPPLY — 86 items
APPLIER CLIP ROT 10 11.4 M/L (STAPLE)
CANISTER SUCTION 2500CC (MISCELLANEOUS) ×3 IMPLANT
CATH KIT ON Q 5IN SLV (PAIN MANAGEMENT) IMPLANT
CATH KIT ON-Q SILVERSOAK 5IN (CATHETERS) ×3 IMPLANT
CATH THORACIC 28FR (CATHETERS) ×3 IMPLANT
CATH THORACIC 28FR RT ANG (CATHETERS) IMPLANT
CATH THORACIC 36FR (CATHETERS) IMPLANT
CATH THORACIC 36FR RT ANG (CATHETERS) IMPLANT
CLIP APPLIE ROT 10 11.4 M/L (STAPLE) IMPLANT
CLIP TI MEDIUM 6 (CLIP) IMPLANT
CONN 1/4X1/4 STERILE (MISCELLANEOUS) ×3 IMPLANT
CONN ST 1/4X3/8  BEN (MISCELLANEOUS) ×1
CONN ST 1/4X3/8 BEN (MISCELLANEOUS) ×2 IMPLANT
CONN Y 3/8X3/8X3/8  BEN (MISCELLANEOUS) ×1
CONN Y 3/8X3/8X3/8 BEN (MISCELLANEOUS) ×2 IMPLANT
CONT SPEC 4OZ CLIKSEAL STRL BL (MISCELLANEOUS) ×18 IMPLANT
COVER SURGICAL LIGHT HANDLE (MISCELLANEOUS) ×3 IMPLANT
CUTTER ECHEON FLEX ENDO 45 340 (ENDOMECHANICALS) ×3 IMPLANT
DERMABOND ADVANCED (GAUZE/BANDAGES/DRESSINGS)
DERMABOND ADVANCED .7 DNX12 (GAUZE/BANDAGES/DRESSINGS) IMPLANT
DRAIN CHANNEL 28F RND 3/8 FF (WOUND CARE) ×3 IMPLANT
DRAIN CHANNEL 32F RND 10.7 FF (WOUND CARE) IMPLANT
DRAPE LAPAROSCOPIC ABDOMINAL (DRAPES) ×3 IMPLANT
DRAPE WARM FLUID 44X44 (DRAPE) ×3 IMPLANT
ELECT REM PT RETURN 9FT ADLT (ELECTROSURGICAL) ×3
ELECTRODE REM PT RTRN 9FT ADLT (ELECTROSURGICAL) ×2 IMPLANT
GAUZE SPONGE 4X4 12PLY STRL (GAUZE/BANDAGES/DRESSINGS) ×3 IMPLANT
GLOVE BIO SURGEON STRL SZ 6.5 (GLOVE) ×3 IMPLANT
GLOVE BIOGEL PI IND STRL 6.5 (GLOVE) ×4 IMPLANT
GLOVE BIOGEL PI INDICATOR 6.5 (GLOVE) ×2
GLOVE ECLIPSE 6.5 STRL STRAW (GLOVE) ×6 IMPLANT
GLOVE SURG SIGNA 7.5 PF LTX (GLOVE) ×6 IMPLANT
GOWN STRL REUS W/ TWL LRG LVL3 (GOWN DISPOSABLE) ×6 IMPLANT
GOWN STRL REUS W/ TWL XL LVL3 (GOWN DISPOSABLE) ×2 IMPLANT
GOWN STRL REUS W/TWL LRG LVL3 (GOWN DISPOSABLE) ×3
GOWN STRL REUS W/TWL XL LVL3 (GOWN DISPOSABLE) ×1
HEMOSTAT SURGICEL 2X14 (HEMOSTASIS) IMPLANT
KIT BASIN OR (CUSTOM PROCEDURE TRAY) ×3 IMPLANT
KIT ROOM TURNOVER OR (KITS) ×3 IMPLANT
KIT SUCTION CATH 14FR (SUCTIONS) ×3 IMPLANT
LIQUID BAND (GAUZE/BANDAGES/DRESSINGS) ×3 IMPLANT
NS IRRIG 1000ML POUR BTL (IV SOLUTION) ×6 IMPLANT
PACK CHEST (CUSTOM PROCEDURE TRAY) ×3 IMPLANT
PAD ARMBOARD 7.5X6 YLW CONV (MISCELLANEOUS) ×6 IMPLANT
POUCH ENDO CATCH II 15MM (MISCELLANEOUS) IMPLANT
POUCH SPECIMEN RETRIEVAL 10MM (ENDOMECHANICALS) IMPLANT
RELOAD GOLD ECHELON 45 (STAPLE) ×30 IMPLANT
RELOAD GREEN ECHELON 45 (STAPLE) ×3 IMPLANT
SEALANT PROGEL (MISCELLANEOUS) IMPLANT
SEALANT SURG COSEAL 4ML (VASCULAR PRODUCTS) IMPLANT
SEALANT SURG COSEAL 8ML (VASCULAR PRODUCTS) IMPLANT
SOLUTION ANTI FOG 6CC (MISCELLANEOUS) ×3 IMPLANT
SPECIMEN JAR MEDIUM (MISCELLANEOUS) ×3 IMPLANT
SPONGE GAUZE 4X4 12PLY STER LF (GAUZE/BANDAGES/DRESSINGS) ×6 IMPLANT
SPONGE INTESTINAL PEANUT (DISPOSABLE) ×21 IMPLANT
SPONGE TONSIL 1 RF SGL (DISPOSABLE) ×3 IMPLANT
STAPLE RELOAD 2.5MM WHITE (STAPLE) ×6 IMPLANT
STAPLER VASCULAR ECHELON 35 (CUTTER) ×3 IMPLANT
SUT PROLENE 4 0 RB 1 (SUTURE) ×1
SUT PROLENE 4-0 RB1 .5 CRCL 36 (SUTURE) ×2 IMPLANT
SUT SILK  1 MH (SUTURE) ×2
SUT SILK 1 MH (SUTURE) ×4 IMPLANT
SUT SILK 2 0SH CR/8 30 (SUTURE) IMPLANT
SUT SILK 3 0 SH 30 (SUTURE) ×3 IMPLANT
SUT SILK 3 0SH CR/8 30 (SUTURE) IMPLANT
SUT VIC AB 1 CTX 36 (SUTURE) ×1
SUT VIC AB 1 CTX36XBRD ANBCTR (SUTURE) ×2 IMPLANT
SUT VIC AB 2-0 CTX 36 (SUTURE) ×3 IMPLANT
SUT VIC AB 2-0 UR6 27 (SUTURE) IMPLANT
SUT VIC AB 3-0 MH 27 (SUTURE) IMPLANT
SUT VIC AB 3-0 X1 27 (SUTURE) ×3 IMPLANT
SUT VICRYL 2 TP 1 (SUTURE) IMPLANT
SWAB COLLECTION DEVICE MRSA (MISCELLANEOUS) IMPLANT
SYSTEM SAHARA CHEST DRAIN ATS (WOUND CARE) ×6 IMPLANT
TAPE CLOTH 4X10 WHT NS (GAUZE/BANDAGES/DRESSINGS) ×3 IMPLANT
TAPE CLOTH SURG 4X10 WHT LF (GAUZE/BANDAGES/DRESSINGS) ×6 IMPLANT
TIP APPLICATOR SPRAY EXTEND 16 (VASCULAR PRODUCTS) IMPLANT
TOWEL OR 17X24 6PK STRL BLUE (TOWEL DISPOSABLE) ×3 IMPLANT
TOWEL OR 17X26 10 PK STRL BLUE (TOWEL DISPOSABLE) ×6 IMPLANT
TRAP SPECIMEN MUCOUS 40CC (MISCELLANEOUS) IMPLANT
TRAY FOLEY CATH 16FRSI W/METER (SET/KITS/TRAYS/PACK) ×3 IMPLANT
TROCAR XCEL BLADELESS 5X75MML (TROCAR) ×3 IMPLANT
TROCAR XCEL NON-BLD 5MMX100MML (ENDOMECHANICALS) IMPLANT
TUBE ANAEROBIC SPECIMEN COL (MISCELLANEOUS) IMPLANT
TUNNELER SHEATH ON-Q 11GX8 DSP (PAIN MANAGEMENT) ×3 IMPLANT
WATER STERILE IRR 1000ML POUR (IV SOLUTION) ×6 IMPLANT

## 2015-10-12 NOTE — Brief Op Note (Addendum)
10/12/2015  10:56 AM  PATIENT:  Anna Larson  49 y.o. female  PRE-OPERATIVE DIAGNOSIS:  RIGHT UPPER LOBE NODULE  POST-OPERATIVE DIAGNOSIS:  NON-SMALL CELL CARCINOMA, RIGHT UPPER LOBE  PROCEDURE:   RIGHT VIDEO ASSISTED THORACOSCOPY  RIGHT UPPER LOBE POSTERIOR SEGMENTECTOMY  MEDIASTINAL LYMPH NODE DISSECTION  On-Q LOCAL ANESTHETIC CATHETER PLACEMENT  SURGEON:  [name], MD  ASSISTANT: Suzzanne Cloud, PA-C  ANESTHESIA:   general  SPECIMEN:  Source of Specimen:  Right upper lobe posterior segment, lymph nodes  DISPOSITION OF SPECIMEN:  Pathology  DRAINS: 76 Fr CT, 28 Blake drain  PATIENT CONDITION:  PACU - hemodynamically stable.  Frozen- non-small cell carcinoma. Bronchial margin negative

## 2015-10-12 NOTE — Transfer of Care (Signed)
Immediate Anesthesia Transfer of Care Note  Patient: Anna Larson  Procedure(s) Performed: Procedure(s): VIDEO ASSISTED THORACOSCOPY (Right) RIGHT UPPER LOBE SEGMENTECTOMY (Right)  Patient Location: PACU  Anesthesia Type:General  Level of Consciousness: awake, alert , oriented and patient cooperative  Airway & Oxygen Therapy: Patient Spontanous Breathing and Patient connected to nasal cannula oxygen  Post-op Assessment: Report given to RN, Post -op Vital signs reviewed and stable and Patient moving all extremities X 4  Post vital signs: Reviewed and stable  Last Vitals:  Filed Vitals:   10/12/15 0608  BP: 120/62  Pulse: 77  Temp: 30.7 C    Complications: No apparent anesthesia complications

## 2015-10-12 NOTE — Op Note (Signed)
NAMEMALEIA, WEEMS NO.:  [id number]  MEDICAL RECORD NO.:  [id number]  LOCATION:  3S09C                        FACILITY:  [facility]  PHYSICIAN:  Revonda Standard. Roxan Hockey, M.D.DATE OF BIRTH:  01/24/1966  DATE OF PROCEDURE:  10/12/2015 DATE OF DISCHARGE:                              OPERATIVE REPORT   PREOPERATIVE DIAGNOSIS:  Right upper lobe nodule.  POSTOPERATIVE DIAGNOSIS:  Non-small cell carcinoma, right upper lobe, clinical stage IA.  PROCEDURES:   Right video-assisted thoracoscopy Right upper lobe posterior segmentectomy Mediastinal lymph node dissection On-Q local anesthetic catheter placement.  SURGEON:  Revonda Standard. Roxan Hockey, M.D.  ASSISTANT:  Suzzanne Cloud, P.A.  ANESTHESIA:  General.  FINDINGS:  A 1-cm nodule palpable in the posterior segment of the right lower lobe.  Multiple benign-appearing lymph nodes.  Frozen section revealed non-small cell carcinoma, bronchial margin clear of tumor.  CLINICAL NOTE:  Mrs. Weidinger is a 49 year old woman with history of the asthma, who was found in 2014 to have a right upper lobe nodule.  This slowly increased in size and recently on PET was hypermetabolic with an SUV max of 3.8.  Given the growth, there was concerned that this could be a low-grade malignancy.  The patient was advised to undergo surgical resection for definitive diagnosis and treatment.  Plan was right video-assisted thoracoscopy and possible segmentectomy or lobectomy depending on intraoperative findings.  The indications, risks, benefits and alternatives were discussed in detail with the patient. She understood and accepted the risks and agreed to proceed.  OPERATIVE NOTE:  Mrs. Bogard was brought to the preoperative holding area on October 12, 2015.  Anesthesia placed a central line and arterial blood pressure monitoring line.  She was taken to the operating room, anesthetized and intubated with a double-lumen endotracheal tube.  A Foley  catheter was placed.  Intravenous antibiotics were administered. Sequential compression devices were placed on the calves for DVT prophylaxis.  She was placed in a left lateral decubitus position and the right chest was prepped and draped in the usual sterile fashion. Single lung ventilation of the left lung was initiated and was tolerated well throughout the procedure.  An incision was made in approximately the sixth intercostal space in the midaxillary line.  A 5-mm port was inserted into the chest and a thoracoscope was advanced into the chest. There was good isolation of the right lung with no cross ventilation. The right lung was relatively slow to deflate.  A second port incision was made anterior to the first for instrumentation.  A 5-cm incision was made just below the right axilla, no rib spreading was performed during the procedure.  The nodule was palpable in the posterior segment of the right upper lobe, it was felt that this was amenable to an anatomical segmentectomy.  Given the small size of the lesion and the relatively slow growth, it was felt that a lobectomy might not be necessary if margins were clear.  The dissection was begun in the fissure initially starting in the major fissure between the middle lobe and the lower lobe, which was nearly complete.  The lower lobe pulmonary artery was identified and dissection was carried along over top  of this artery. There was a lymph node overlying the artery, which was removed. This node was sent as a separate specimen for permanent pathology, as were all the other nodes that were removed during the procedure.  The superior segmental branch to the right lower lobe was identified and the dissection was carried over this vessel.  The fissure then was completed with sequential firings of endoscopic GIA stapler.  The Ethicon Echelon powered stapler was used with gold cartridges.  A node adjacent to the posterior ascending branch of  the right posterior ascending artery was dissected off the posterior ascending artery, which was then was encircled and divided with the endoscopic vascular stapler.  The right upper lobe bronchus was visualized and the posterior segmental bronchus was dissected out.  An endoscopic stapler with a green cartridge was placed across the posterior segmental bronchus and closed.  A test inflation showed good aeration of the remainder of the right lung.  The stapler was fired transecting the bronchus.  There was some bleeding, which was controlled with direct pressure.  The segmentectomy then was completed with sequential firings of the stapler again using the gold cartridges.  The specimen was placed into an endoscopic retrieval bag and removed through the incision.  There was a 2-3 cm gross margin.  Specimen was sent for frozen section of the nodule and the bronchial margin.  The nodule returned showing non-small cell carcinoma.  The bronchial margin was clear.  Additional lymph nodes including level 11 and level 7 nodes were removed.  An On-Q local anesthetic catheter was placed through a separate stab incision posteriorly and tunneled into a subpleural Location. It was primed with 5 mL of 0.5% bupivacaine and secured to the skin with a 3-0 silk suture.  The chest was copiously irrigated with warm saline.  A test inflation showed no significant air leakage from the fissure or the bronchial stump.  Given that there was a good gross margin on the tumor and a negative bronchial margin, it was felt that lobectomy was not necessary.  A 28-French Blake drain was placed through the original port incision and directed posteriorly.  A 28-French chest tube was placed through the more anterior incision and directed anteriorly, both were secured to the skin with #1 silk sutures.  The right lung was reinflated and there was good expansion of the lower and middle lobes as well as the remainder of the upper  lobe.  The working incision was closed in three layers in the standard fashion.  The chest tubes were placed to suction.  The patient was placed back in a supine position.  She was extubated in the operating room and taken to the postanesthetic care unit in good condition.     Revonda Standard Roxan Hockey, M.D.     SCH/MEDQ  D:  10/12/2015  T:  10/12/2015  Job:  [id number]

## 2015-10-12 NOTE — Anesthesia Procedure Notes (Signed)
Procedure Name: Intubation Date/Time: 10/12/2015 7:46 AM Performed by: Carney Living Pre-anesthesia Checklist: Patient identified, Emergency Drugs available, Suction available, Patient being monitored and Timeout performed Patient Re-evaluated:Patient Re-evaluated prior to inductionOxygen Delivery Method: Circle system utilized Preoxygenation: Pre-oxygenation with 100% oxygen Intubation Type: IV induction Ventilation: Mask ventilation without difficulty Laryngoscope Size: Mac and 4 Grade View: Grade I Endobronchial tube: Left, Double lumen EBT, EBT position confirmed by auscultation and EBT position confirmed by fiberoptic bronchoscope and 39 Fr Number of attempts: 1 Airway Equipment and Method: Stylet Placement Confirmation: ETT inserted through vocal cords under direct vision,  positive ETCO2 and breath sounds checked- equal and bilateral Secured at: 29 cm Tube secured with: Tape Dental Injury: Teeth and Oropharynx as per pre-operative assessment

## 2015-10-12 NOTE — Anesthesia Preprocedure Evaluation (Addendum)
Anesthesia Evaluation  Patient identified by MRN, date of birth, ID band Patient awake    Reviewed: Allergy & Precautions, NPO status , Patient's Chart, lab work & pertinent test results, reviewed documented beta blocker date and time   Airway Mallampati: II  TM Distance: >3 FB Neck ROM: Full    Dental  (+) Teeth Intact, Dental Advisory Given   Pulmonary asthma , COPD,  COPD inhaler,    Pulmonary exam normal        Cardiovascular Normal cardiovascular exam     Neuro/Psych    GI/Hepatic GERD  Controlled,  Endo/Other  Morbid obesity  Renal/GU      Musculoskeletal  (+) Arthritis , Osteoarthritis,    Abdominal   Peds  Hematology   Anesthesia Other Findings   Reproductive/Obstetrics                            Anesthesia Physical Anesthesia Plan  ASA: III  Anesthesia Plan: General   Post-op Pain Management:    Induction: Intravenous  Airway Management Planned: Double Lumen EBT  Additional Equipment: Arterial line  Intra-op Plan:   Post-operative Plan: Extubation in OR  Informed Consent: I have reviewed the patients History and Physical, chart, labs and discussed the procedure including the risks, benefits and alternatives for the proposed anesthesia with the patient or authorized representative who has indicated his/her understanding and acceptance.   Dental advisory given  Plan Discussed with: CRNA and Surgeon  Anesthesia Plan Comments:        Anesthesia Quick Evaluation

## 2015-10-12 NOTE — Care Management Note (Signed)
Case Management Note  Patient Details  Name: Anna Larson MRN: [id number] Date of Birth: 11-25-1965  Subjective/Objective:                 Admitted with R upper lobe nodule, s/p RIGHT VIDEO ASSISTED THORACOSCOPY  RIGHT UPPER LOBE POSTERIOR SEGMENTECTOMY  MEDIASTINAL LYMPH NODE DISSECTION On-Q LOCAL ANESTHETIC CATHETER PLACEMENT.  Action/Plan: Return to home when medically stable. CM to f/u with d/c needs.  Expected Discharge Date:                  Expected Discharge Plan:  Home/Self Care  In-House Referral:     Discharge planning Services  CM Consult  Post Acute Care Choice:    Choice offered to:     DME Arranged:    DME Agency:     HH Arranged:    HH Agency:     Status of Service:  In process, will continue to follow  Medicare Important Message Given:    Date Medicare IM Given:    Medicare IM give by:    Date Additional Medicare IM Given:    Additional Medicare Important Message give by:     If discussed at [place] of Stay Meetings, dates discussed:    Additional Comments: Ciana Simmon (ESLPNP)[phone], Theona Muhs [facility])  [phone]  Whitman Hero [place], [place] [phone] 10/12/2015, 2:56 PM

## 2015-10-12 NOTE — Progress Notes (Signed)
UR COMPLETED  

## 2015-10-12 NOTE — Interval H&P Note (Signed)
History and Physical Interval Note:  10/12/2015 7:21 AM  Anna Larson  has presented today for surgery, with the diagnosis of RUL NODULE  The various methods of treatment have been discussed with the patient and family. After consideration of risks, benefits and other options for treatment, the patient has consented to  Procedure(s): VIDEO ASSISTED THORACOSCOPY (Right) RIGHT UPPER LOBE SEGMENTECTOMY (Right) POSSIBLE LOBECTOMY (Right) as a surgical intervention .  [name] history has been reviewed, patient examined, no change in status, stable for surgery.  I have reviewed the patient's chart and labs.  Questions were answered to the patient's satisfaction.     [name]

## 2015-10-12 NOTE — Anesthesia Postprocedure Evaluation (Signed)
Anesthesia Post Note  Patient: Anna Larson  Procedure(s) Performed: Procedure(s) (LRB): VIDEO ASSISTED THORACOSCOPY (Right) RIGHT UPPER LOBE SEGMENTECTOMY (Right)  Patient location during evaluation: PACU Anesthesia Type: General Level of consciousness: awake and alert Pain management: pain level controlled Vital Signs Assessment: post-procedure vital signs reviewed and stable Respiratory status: spontaneous breathing, nonlabored ventilation, respiratory function stable and patient connected to nasal cannula oxygen Cardiovascular status: blood pressure returned to baseline and stable Postop Assessment: no signs of nausea or vomiting Anesthetic complications: no    Last Vitals:  Filed Vitals:   10/12/15 1230 10/12/15 1245  BP: 137/85 133/81  Pulse: 92 91  Temp:    Resp: 18 18    Last Pain:  Filed Vitals:   10/12/15 1256  PainSc: 7                  Johathon Overturf DAVID

## 2015-10-12 NOTE — H&P (Signed)
PCP is No PCP Per Patient Referring Provider is Javier Glazier, MD  Chief Complaint  Patient presents with  . Lung Lesion    Surgical eval, Chest CT-super D, PET Scan 08/10/15    HPI: 49 year old woman with a past medical history significant for asthma, COPD, gastroesophageal reflux, nephrolithiasis, arthritis, chronic cough, and obesity. She is a lifelong nonsmoker. She was being evaluated for a chronic cough in 2014. She saw Dr. Joya Gaskins. As part of the workup he did a CT of the chest. There was an 11 x 7 mm nodule in the right upper lobe. She has been followed since that time.  She recently had a follow-up CT which showed the nodule had increased in size. A PET CT was done. The nodule was hypermetabolic with an SUV of 3.8. There was no hilar or mediastinal adenopathy.  She has never smoked. She denies change in appetite or weight loss. She does complain of decreased energy. She has chronic shortness of breath with exertion and a chronic dry cough. She also complains of swelling in her left ankle (due to arthritis). She has not had any unusual headaches or visual changes. She occasionally has wheezing, but has not had any recently. She works full-time.  Zubrod Score: At the time of surgery this patient's most appropriate activity status/level should be described as: '[X]'$  0 Normal activity, no symptoms '[ ]'$  1 Restricted in physical strenuous activity but ambulatory, able to do out light work '[ ]'$  2 Ambulatory and capable of self care, unable to do work activities, up and about >50 % of waking hours  '[ ]'$  3 Only limited self care, in bed greater than 50% of waking hours '[ ]'$  4 Completely disabled, no self care, confined to bed or chair '[ ]'$  5 Moribund    Past Medical History  Diagnosis Date  . Cough   . Asthma   . COPD (chronic obstructive pulmonary disease) ([place])   . GERD  (gastroesophageal reflux disease)   . Lung nodule < 6cm on CT   . Allergic rhinitis   . Arthritis   . Kidney stone     Past Surgical History  Procedure Laterality Date  . Tubal ligation    . Lithotripsy    . Cesarean section      Family History  Problem Relation Age of Onset  . Bone cancer Maternal Grandmother   . Cancer Maternal Grandmother     bone  . Multiple sclerosis Mother   . Prostate cancer Paternal Grandfather   . GER disease Father   . Emphysema Maternal Grandfather   . Cancer Maternal Grandfather     emphysema    Social History Social History  Substance Use Topics  . Smoking status: Passive Smoke Exposure - Never Smoker  . Smokeless tobacco: Never Used     Comment: both mothers smoked  . Alcohol Use: 0.0 oz/week    0 Standard drinks or equivalent per week     Comment: occasional    Current Outpatient Prescriptions  Medication Sig Dispense Refill  . budesonide-formoterol (SYMBICORT) 80-4.5 MCG/ACT inhaler Inhale 2 puffs into the lungs 2 (two) times daily. 1 Inhaler 3  . chlorpheniramine (CHLOR-TRIMETON) 4 MG tablet Take 3 tablets (12 mg total) by mouth daily. (Patient taking differently: Take 12 mg by mouth at bedtime. ) 90 tablet 2  . ferrous sulfate 325 (65 FE) MG tablet Take 1 tablet (325 mg total) by mouth 3 (three) times daily with meals. 90 tablet 3  No current facility-administered medications for this visit.    Allergies  Allergen Reactions  . Codeine Rash    Review of Systems  Constitutional: Negative for fever, activity change, appetite change, fatigue and unexpected weight change.  HENT: Negative for congestion, dental problem and nosebleeds.  Eyes: Negative for photophobia and visual disturbance.  Respiratory: Positive for cough (nonproductive), shortness of breath (With exertion) and wheezing.  Cardiovascular:  Positive for leg swelling (Left ankle and foot). Negative for chest pain and palpitations.  Gastrointestinal: Negative for diarrhea, constipation and blood in stool.   Heartburn  Endocrine: Negative for cold intolerance and heat intolerance.  Genitourinary: Negative for hematuria and difficulty urinating.   History of kidney stones  Musculoskeletal: Positive for joint swelling (left ankle) and arthralgias.  Skin: Negative for color change and rash.  Neurological: Negative for dizziness, syncope, weakness and headaches.  Hematological: Negative for adenopathy. Does not bruise/bleed easily.   Anemic  All other systems reviewed and are negative.   BP 117/83 mmHg  Pulse 100  Resp 16  Ht '5\' 7"'$  (1.702 m)  Wt 258 lb (117.028 kg)  BMI 40.40 kg/m2  SpO2 98%  LMP 08/28/2015 Physical Exam  Constitutional: She is oriented to person, place, and time. No distress.  Obese  HENT:  Head: Normocephalic and atraumatic.  Mouth/Throat: Oropharynx is clear and moist. No oropharyngeal exudate.  Eyes: Conjunctivae and EOM are normal. Pupils are equal, round, and reactive to light. No scleral icterus.  Neck: Normal range of motion. No tracheal deviation present. No thyromegaly present.  Cardiovascular: Normal rate, regular rhythm, normal heart sounds and intact distal pulses. Exam reveals no gallop and no friction rub.  No murmur heard. Pulmonary/Chest: Effort normal and breath sounds normal. No accessory muscle usage. No respiratory distress. She has no wheezes. She has no rales.  Abdominal: Soft. She exhibits no distension. There is no tenderness.  Musculoskeletal: Normal range of motion. She exhibits edema (Trace edema left ankle). She exhibits no tenderness.  Lymphadenopathy:   She has no cervical adenopathy.  Neurological: She is alert and oriented to person, place, and time. She exhibits normal muscle tone. Coordination normal.  Motor 5 out of 5 bilaterally  Skin: Skin is  warm. No rash noted. She is not diaphoretic.  Psychiatric: She has a normal mood and affect.  Vitals reviewed.    Diagnostic Tests: CT CHEST WITHOUT CONTRAST  TECHNIQUE: Multidetector CT imaging of the chest was performed using thin slice collimation for electromagnetic bronchoscopy planning purposes, without intravenous contrast.  COMPARISON: Multiple exams, including 07/23/2015  FINDINGS: Mediastinum/Lymph Nodes: Small mediastinal lymph nodes are not pathologically enlarged.  Lungs/Pleura: Different slice selection on today' s exam leads to a different longitudinal measurement of the right upper lobe lesion, but the lesion continues to measure 10 mm in thickness. Compensating for the slice selection issue, the longitudinal size the lesion is stable. The lesion has a branching distal margin and sleeves slightly larger than on 02/07/2013. No other nodules. This lesion was mildly hypermetabolic on prior PET-CT.  Upper abdomen: Unremarkable  Musculoskeletal: Small hemangioma in the T6 vertebra.  IMPRESSION: 1. Stable size of the right upper lobe lesion from 07/23/2015. Although the morphologic characteristics could be seen in a bronchocele, the fact that this was mildly hypermetabolic on PET-CT indicates the likelihood of a low-grade neoplasm. No findings of adenopathy or metastatic disease.   Electronically Signed  By: Van Clines M.D.  On: 08/31/2015 17:59      NUCLEAR MEDICINE PET SKULL BASE TO  THIGH  TECHNIQUE: 13.26 mCi F-18 FDG was injected intravenously. Full-ring PET imaging was performed from the skull base to thigh after the radiotracer. CT data was obtained and used for attenuation correction and anatomic localization.  FASTING BLOOD GLUCOSE: Value: 93 mg/dl  COMPARISON: Chest CT 07/23/2015  FINDINGS: NECK  Moderate activity noted bilaterally in the tonsillar regions could be due to tonsillar inflammation and/or  lymphoid hypertrophy. No enlarged neck nodes. Slight increased activity in both submandibular glands. Slight activity noted in the submental region also.  CHEST  The 14 mm right upper lobe pulmonary lesion is metabolically active with SUV max of 3.8. No enlarged or hypermetabolic mediastinal or hilar lymph nodes. No other worrisome pulmonary lesions. Small scattered bilateral axillary lymph nodes with mild hypermetabolism. A left-sided axillary lymph node has an SUV max of 2.7.  ABDOMEN/PELVIS  No abnormal hypermetabolic activity within the liver, pancreas, adrenal glands, or spleen. No hypermetabolic lymph nodes in the abdomen or pelvis. Mild activity in the endometrium is likely due to secretory phase of ovulation. Multiple small inguinal lymph nodes with mild hypermetabolism. The left measures 3.9 and the right measures 3.8.  SKELETON  Diffuse increased metabolic activity in the bony structures, most notably the spine and pelvis. There are also areas of significant muscular activity without obvious lesion on the CT scan. Does this patient have some type of collagen vascular disease? Marrow stimulating drugs could have this appearance.  IMPRESSION: 1. Mild hypermetabolism in the right upper lobe pulmonary nodule suspicious for low grade neoplasm. No mediastinal or hilar lymphadenopathy. 2. Scattered small axillary and inguinal lymph nodes are mildly hypermetabolic and may suggest inflammatory change. 3. Diffuse osseous hypermetabolism began multiple areas of muscle hypermetabolism. No obvious lesions are seen on the CT scan. Findings could be due to collagen vascular disease or marrow stimulating drugs. Drugs causing muscle inflammation such as cholesterol lowering drugs would also be a consideration. 4. Diffuse and symmetric uptake in the tonsillar regions may be due to inflammation normal lymphoid hyperplasia.   Electronically Signed  By: Marijo Sanes  M.D.  On: 08/10/2015 15:11  Pulmonary function testing FVC 3.09 (78%) FEV1 2.07 (66%) FEV1 postbronchodilator 1.05 (33%) ?! DLCO 69%  I personally reviewed the CT and PET CT and concur with the findings and the official report above.  Impression: Mrs. Fildes is a 50 year old woman with a 10 x 18 mm nodule in the right upper lobe that is hypermetabolic on PET CT with an SUV max of 3.8. She is a lifelong nonsmoker. This nodule is relatively smoothly marginated and has grown very slowly over time. 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 I cannot rule out the possibility of a low-grade adenocarcinoma. I reviewed the scans and discussed the differential diagnosis with Mrs. Swaminathan and her family.  She has a history of COPD. Her coronary function testing showed a mixed restrictive and obstructive pattern. Her FEV1 was 2.07. Not sure what to make of her postbronchodilator result of 1.05. Her clinical symptomatology is much more consistent with a prebronchodilator number. I do think she has adequate reserve to tolerate a pulmonary resection.  My recommendation to Mrs. Hainsworth was that we proceed with surgical resection for definitive diagnosis and treatment. We discussed the options of continued radiographic follow-up, CT guided biopsy, and bronchoscopic biopsy. We discussed the relative advantages and disadvantages of each of these approaches.  I described the proposed operation to Mrs. Whiters and her family in detail. We will plan to do a right VATS and  right upper lobe segmentectomy (simple wedge resection if anatomically feasible). If frozen section revealed carcinoma we will proceed with a right upper lobectomy. I informed them of the general nature of the procedure, the need for general anesthesia, the incisions to be used, the expected hospital stay, and the overall recovery. I reviewed the indications, risks, benefits, and alternatives. They understand the risk include, but are not limited to death, MI,  DVT, PE, bleeding, possible need for transfusion, infection, prolonged air leak, cardiac arrhythmias, as well as the possibility of other unforeseeable complications. She does have mildly increased risk for some complications due to her morbid obesity, but overall is a low risk candidate.  She understands and accepts the risk and agrees to proceed  Plan: Right VATS, right upper lobe segmentectomy, possible lobectomy on Monday, 10/12/2015  [name], MD [facility] [phone]        No interval change  Revonda Standard. Roxan Hockey, MD [facility] [phone]

## 2015-10-13 ENCOUNTER — Inpatient Hospital Stay (HOSPITAL_COMMUNITY): Payer: 59

## 2015-10-13 ENCOUNTER — Encounter (HOSPITAL_COMMUNITY): Payer: Self-pay | Admitting: Thoracic Surgery (Cardiothoracic Vascular Surgery)

## 2015-10-13 LAB — BASIC METABOLIC PANEL
ANION GAP: 5 (ref 5–15)
BUN: <5 mg/dL — ABNORMAL LOW (ref 6–20)
CALCIUM: 8.3 mg/dL — AB (ref 8.9–10.3)
CO2: 23 mmol/L (ref 22–32)
Chloride: 107 mmol/L (ref 101–111)
Creatinine, Ser: 0.52 mg/dL (ref 0.44–1.00)
GLUCOSE: 115 mg/dL — AB (ref 65–99)
Potassium: 4.2 mmol/L (ref 3.5–5.1)
Sodium: 135 mmol/L (ref 135–145)

## 2015-10-13 LAB — CBC
HCT: 37.6 % (ref 36.0–46.0)
Hemoglobin: 10.8 g/dL — ABNORMAL LOW (ref 12.0–15.0)
MCH: 20.3 pg — ABNORMAL LOW (ref 26.0–34.0)
MCHC: 28.7 g/dL — ABNORMAL LOW (ref 30.0–36.0)
MCV: 70.7 fL — AB (ref 78.0–100.0)
PLATELETS: 186 10*3/uL (ref 150–400)
RBC: 5.32 MIL/uL — ABNORMAL HIGH (ref 3.87–5.11)
RDW: 26.4 % — AB (ref 11.5–15.5)
WBC: 9 10*3/uL (ref 4.0–10.5)

## 2015-10-13 MED ORDER — ENOXAPARIN SODIUM 40 MG/0.4ML ~~LOC~~ SOLN
40.0000 mg | SUBCUTANEOUS | Status: DC
  Administered 2015-10-13 – 2015-10-15 (×3): 40 mg via SUBCUTANEOUS
  Filled 2015-10-13 (×3): qty 0.4

## 2015-10-13 MED ORDER — MUPIROCIN 2 % EX OINT
1.0000 "application " | TOPICAL_OINTMENT | Freq: Two times a day (BID) | CUTANEOUS | Status: DC
  Administered 2015-10-13 – 2015-10-15 (×5): 1 via NASAL
  Filled 2015-10-13: qty 22

## 2015-10-13 MED ORDER — CHLORHEXIDINE GLUCONATE CLOTH 2 % EX PADS
6.0000 | MEDICATED_PAD | Freq: Every day | CUTANEOUS | Status: DC
  Administered 2015-10-13 – 2015-10-15 (×3): 6 via TOPICAL

## 2015-10-13 NOTE — Progress Notes (Addendum)
[address]       [place] [address]             [phone]          1 Day Post-Op Procedure(s) (LRB): VIDEO ASSISTED THORACOSCOPY (Right) RIGHT UPPER LOBE SEGMENTECTOMY (Right)  Subjective: Comfortable, breathing stable. No nausea.   Objective: Vital signs in last 24 hours: Patient Vitals for the past 24 hrs:  BP Temp Temp src Pulse Resp SpO2 Height Weight  10/13/15 0404 - 98.1 F (36.7 C) Oral - - - - -  10/13/15 0355 - - - - 17 100 % - -  10/13/15 0349 - - - - 17 100 % - -  10/13/15 0346 128/89 mmHg - - 64 19 99 % - -  10/13/15 0045 - - - - 17 100 % - -  10/13/15 0000 - - - - 17 100 % - -  10/12/15 2256 - 98.2 F (36.8 C) Oral - - - - -  10/12/15 2253 124/72 mmHg - - 65 17 100 % - -  10/12/15 2121 - - - - - 100 % - -  10/12/15 2048 114/61 mmHg - - 72 19 100 % - -  10/12/15 2040 - - - - 16 100 % - -  10/12/15 2000 - - - - 16 100 % - -  10/12/15 1948 - 98.6 F (37 C) Oral - - - - -  10/12/15 1944 - - - 87 14 100 % - -  10/12/15 1658 - - - - (!) 100 - - -  10/12/15 1636 125/79 mmHg - - 84 18 - - -  10/12/15 1500 126/86 mmHg 98.1 F (36.7 C) Oral 80 20 97 % '5\' 9"'$  (1.753 m) 258 lb 6.1 oz (117.2 kg)  10/12/15 1415 - 98.2 F (36.8 C) - 95 19 99 % - -  10/12/15 1400 (!) 141/83 mmHg - - 87 17 98 % - -  10/12/15 1345 - - - 87 18 98 % - -  10/12/15 1330 - - - 89 (!) 21 98 % - -  10/12/15 1315 - - - 87 17 98 % - -  10/12/15 1300 - - - 97 18 100 % - -  10/12/15 1245 133/81 mmHg - - 91 18 97 % - -  10/12/15 1230 137/85 mmHg - - 92 18 98 % - -  10/12/15 1215 (!) 124/104 mmHg - - 94 18 100 % - -  10/12/15 1200 (!) 146/99 mmHg - - 89 14 99 % - -  10/12/15 1154 - - - - - 97 % - -  10/12/15 1145 125/88 mmHg - - 95 14 98 % - -  10/12/15 1130 137/84 mmHg 97.9 F (36.6 C) - (!) 104 15 99 % - -   Current Weight  10/12/15 258 lb 6.1 oz (117.2 kg)     Intake/Output from previous day: 12/05 0701 - 12/06 0700 In: 2113.3 [P.O.:120; I.V.:1943.3; IV  Piggyback:50] Out: 2978 [Urine:2550; Blood:200; Chest Tube:228]    PHYSICAL EXAM:  Heart: RRR Lungs: Slightly decreased BS in R base Wound: Clean and dry Chest tube: No air leak with cough    Lab Results: CBC: Recent Labs  10/13/15 0414  WBC 9.0  HGB 10.8*  HCT 37.6  PLT 186   BMET:  Recent Labs  10/13/15 0414  NA 135  K 4.2  CL 107  CO2 23  GLUCOSE 115*  BUN <5*  CREATININE 0.52  CALCIUM 8.3*    PT/INR: No results for input(s): LABPROT, INR in the last 72 hours.  CXR: FINDINGS: Portable AP semi upright view at 0623 hours. Stable right IJ central line. Two right chest tubes are stable. Lower lung volumes. No pneumothorax. Stable cardiac size and mediastinal contours. No pulmonary edema. No consolidation or definite effusion. Patchy right perihilar opacity is postoperative.  IMPRESSION: 1. Stable lines and tubes. 2. Lower lung volumes. No pneumothorax or new cardiopulmonary abnormality.   Assessment/Plan: S/P Procedure(s) (LRB): VIDEO ASSISTED THORACOSCOPY (Right) RIGHT UPPER LOBE SEGMENTECTOMY (Right) CT with no air leak and CXR stable with no ptx. Hopefully can place CTs to water seal. Output around 100 ml/shift serosanguinous fluid. Will start pulm toilet, mobilize, advance diet.   D/c a-line, leave Foley one more day, routine POD #1 progression.   LOS: 1 day    COLLINS,GINA H 10/13/2015  Patient seen and examined, agree with above Minimal drainage- dc posterior CT Place anterior tube to water seal Add enoxaparin for additional DVT prophylaxis  Remo Lipps C. Roxan Hockey, MD [facility] [phone]

## 2015-10-13 NOTE — Progress Notes (Signed)
Received call from RN that she wanted abg syringe. RN stated that she would draw ABG from [facility].

## 2015-10-13 NOTE — Progress Notes (Signed)
Posterior chest tube removed as per order. Verified removal with Willette Alma, RN who assisted. Patient tolerated procedure well. Will cont to monitor.

## 2015-10-14 ENCOUNTER — Inpatient Hospital Stay (HOSPITAL_COMMUNITY): Payer: 59

## 2015-10-14 LAB — COMPREHENSIVE METABOLIC PANEL
ALBUMIN: 3 g/dL — AB (ref 3.5–5.0)
ALK PHOS: 83 U/L (ref 38–126)
ALT: 14 U/L (ref 14–54)
AST: 15 U/L (ref 15–41)
Anion gap: 5 (ref 5–15)
BUN: <5 mg/dL — ABNORMAL LOW (ref 6–20)
CALCIUM: 8.2 mg/dL — AB (ref 8.9–10.3)
CO2: 23 mmol/L (ref 22–32)
CREATININE: 0.48 mg/dL (ref 0.44–1.00)
Chloride: 110 mmol/L (ref 101–111)
GFR calc Af Amer: >60 mL/min (ref >60)
GFR calc non Af Amer: >60 mL/min (ref >60)
GLUCOSE: 94 mg/dL (ref 65–99)
Potassium: 3.8 mmol/L (ref 3.5–5.1)
SODIUM: 138 mmol/L (ref 135–145)
Total Bilirubin: 0.4 mg/dL (ref 0.3–1.2)
Total Protein: 5.6 g/dL — ABNORMAL LOW (ref 6.5–8.1)

## 2015-10-14 LAB — CBC
HEMATOCRIT: 37.8 % (ref 36.0–46.0)
Hemoglobin: 10.4 g/dL — ABNORMAL LOW (ref 12.0–15.0)
MCH: 19.7 pg — AB (ref 26.0–34.0)
MCHC: 27.5 g/dL — AB (ref 30.0–36.0)
MCV: 71.5 fL — AB (ref 78.0–100.0)
PLATELETS: 202 10*3/uL (ref 150–400)
RBC: 5.29 MIL/uL — ABNORMAL HIGH (ref 3.87–5.11)
RDW: 26.4 % — AB (ref 11.5–15.5)
WBC: 8.1 10*3/uL (ref 4.0–10.5)

## 2015-10-14 NOTE — Progress Notes (Addendum)
Physician notified: Ellwood Handler, PA At: 1143  Regarding: DC R chest tube, onQ pump, and R IJ. Pt c/o cramping at chest tube site. No SOB-RR 19, 100% on RA. Slight improvement by cough and deep breathing--encouraged IS. CXR planned for AM.  Awaiting return response.   Returned Response at: 1145  Order(s): Will get portable cxr. Continue to monitor.

## 2015-10-14 NOTE — Progress Notes (Addendum)
Waste PCA 40m fentanyl in sink with WMerlene Laughter RN.

## 2015-10-14 NOTE — Discharge Summary (Signed)
Physician Discharge Summary  Patient ID: KODY VIGIL MRN: [id number] DOB/AGE: 10-Jun-1966 49 y.o.  Admit date: 10/12/2015 Discharge date: 10/15/2015  Admission Diagnoses: Right upper lobe lung nodule Patient Active Problem List   Diagnosis Date Noted  . Non-small cell carcinoma of lung, right ([place]) 10/12/2015  . Hx of adenomatous + sessile serrated colonic polyps 09/22/2015  . Anemia, iron deficiency   . Morbid obesity ([place]) 09/09/2015  . COPD (chronic obstructive pulmonary disease) ([place]) 08/26/2015  . Allergic rhinitis 08/26/2015  . GERD (gastroesophageal reflux disease) 08/26/2015  . Lung nodule seen on imaging study   . Cough variant asthma     Discharge Diagnoses:  Neuroendocrine tumor right upper lobe, atypical carcinoid, T1N1- stage IIA Patient Active Problem List   Diagnosis Date Noted  . Non-small cell carcinoma of lung, right ([place]) 10/12/2015  . Hx of adenomatous + sessile serrated colonic polyps 09/22/2015  . Anemia, iron deficiency   . Morbid obesity ([place]) 09/09/2015  . COPD (chronic obstructive pulmonary disease) ([place]) 08/26/2015  . Allergic rhinitis 08/26/2015  . GERD (gastroesophageal reflux disease) 08/26/2015  . Lung nodule seen on imaging study   . Cough variant asthma    Discharged Condition: good   History of Present Illness:  Ms. Severs is a 49 yo woman with a past medical history significant for asthma, COPD, gastroesophageal reflux, nephrolithiasis, arthritis, chronic cough, and obesity. She is a lifelong nonsmoker. She was being evaluated for a chronic cough in 2014. She saw Dr. Joya Gaskins. As part of the workup he did a CT of the chest. There was an 11 x 7 mm nodule in the right upper lobe. She has been followed since that time. She recently had a follow-up CT which showed the nodule had increased in size. A PET CT was done. The nodule was hypermetabolic with an SUV of 3.8. There was no hilar or mediastinal adenopathy.  It was felt surgical resection would  be indicated and she was referred to [facility] for evaluation.  She was evaluated by Dr. Roxan Hockey on 09/09/2015, at which time he recommend VATS with surgical resection vs. Lobectomy should the lesion be cancer.  The risks and benefits of the procedure were explained to the patient and she was agreeable to proceed.   Hospital Course:   Ms. Schoening presented to [facility].  She underwent Right Video Assisted Thoracoscopy with Right Upper Lobe Posterior Segmentectomy and Lymph node dissection.  She tolerated the procedure without difficulty, was extubated and taken to the PACU in stable condition.  The patient has done well postoperatively.  Her chest tubes have been free from air leak and were weaned off suction on POD #1.  Chest tube output remained low and her posterior chest tube was removed without difficulty.  Follow up CXR showed no pneumothorax.  Chest tube continued to be free from air leak.  Therefore her final chest tube was removed without difficulty on postop day 2.  Repeat CXR was obtained and showed a stable right apical pneumothorax/space.  She is ambulating without difficulty.  Her pain is well controlled.  She is tolerating a diet.  She is felt medically stable for discharge home today.  The intraoperative frozen section diagnosis was non-small cell carcinoma, but the final pathology revealed a neuroendocrine tumor (atypical carcinoid) T1a, N1.       Significant Diagnostic Studies: nuclear medicine: PET  1. Mild hypermetabolism in the right upper lobe pulmonary nodule suspicious for low grade neoplasm. No mediastinal or hilar  lymphadenopathy. 2. Scattered small axillary and inguinal lymph nodes are mildly hypermetabolic and may suggest inflammatory change. 3. Diffuse osseous hypermetabolism began multiple areas of muscle hypermetabolism. No obvious lesions are seen on the CT scan. Findings could be due to collagen vascular disease or marrow stimulating drugs. Drugs  causing muscle inflammation such as cholesterol lowering drugs would also be a consideration. 4. Diffuse and symmetric uptake in the tonsillar regions may be due to inflammation normal lymphoid hyperplasia.   Treatments: surgery:   Right video-assisted thoracoscopy, right upper lobe posterior segmentectomy, mediastinal lymph node dissection, On-Q local anesthetic catheter placement.   Disposition: 01-Home or Self Care      Discharge Instructions    Discharge instructions    Complete by:  As directed   During your recent anesthetic, you were given the medication sugammadex ([place]). This medication interacts with hormonal forms of birth control (oral contraceptives and injected or implanted birth control) and may make them ineffective. IFYOU USE ANY HORMONAL FORM OF BIRTH CONTROL, YOU MUST USE AN ADDITIONAL BARRIER BIRTH CONTROL FOR METHOD FOR SEVEN DAYS after receiving sugammadex ([place]) or there is a chance you could become pregnant.           Discharge Medications:    Medication List    TAKE these medications        budesonide-formoterol 80-4.5 MCG/ACT inhaler  Commonly known as:  SYMBICORT  Inhale 2 puffs into the lungs 2 (two) times daily.     chlorpheniramine 4 MG tablet  Commonly known as:  CHLOR-TRIMETON  Take 3 tablets (12 mg total) by mouth at bedtime.     ferrous sulfate 325 (65 FE) MG tablet  Take 1 tablet (325 mg total) by mouth 3 (three) times daily with meals.     oxyCODONE 5 MG immediate release tablet  Commonly known as:  Oxy IR/ROXICODONE  Take 1-2 tablets (5-10 mg total) by mouth every 4 (four) hours as needed for severe pain.         Follow Up:   Follow-up Information    Follow up with [name], MD On 11/03/2015.   Specialty:  Cardiothoracic Surgery   Why:  Have a chest x-ray at [facility] at 11:00, then see MD at 12:00   Contact information:   [address] [place] [address] [phone]        Follow up with [facility] RN  In 1 week.   Why:  For suture removal - office will call you to schedule          Signed: COLLINS,GINA H 10/15/2015, 7:55 AM

## 2015-10-14 NOTE — Progress Notes (Addendum)
[address]       [place] [address]             [phone]          2 Days Post-Op Procedure(s) (LRB): VIDEO ASSISTED THORACOSCOPY (Right) RIGHT UPPER LOBE SEGMENTECTOMY (Right)  Subjective: Feels well except sore under arm. Walked multiple times yesterday and already walked this am. Breathing stable.   Objective: Vital signs in last 24 hours: Patient Vitals for the past 24 hrs:  BP Temp Temp src Pulse Resp SpO2  10/14/15 0733 - 98.1 F (36.7 C) Oral - - -  10/14/15 0429 122/72 mmHg - - 91 16 95 %  10/14/15 0423 - 98.1 F (36.7 C) Oral - - -  10/13/15 2324 126/85 mmHg - - 80 15 98 %  10/13/15 2323 - 98.4 F (36.9 C) Oral - - -  10/13/15 1939 (!) 135/92 mmHg - - 81 (!) 21 99 %  10/13/15 1936 - 98.4 F (36.9 C) Oral - - -  10/13/15 1700 - 98.7 F (37.1 C) Oral - - -  10/13/15 1523 130/82 mmHg - - 90 (!) 25 95 %  10/13/15 1205 131/85 mmHg 98.3 F (36.8 C) Oral 81 19 94 %  10/13/15 1200 - - - - (!) 21 94 %  10/13/15 0838 - - - - - 100 %  10/13/15 0820 - 98.2 F (36.8 C) Oral - - -   Current Weight  10/12/15 258 lb 6.1 oz (117.2 kg)     Intake/Output from previous day: 12/06 0701 - 12/07 0700 In: 2781.7 [P.O.:1680; I.V.:1101.7] Out: 2315 [Urine:2175; Chest Tube:140]    PHYSICAL EXAM:  Heart: RRR Lungs: Clear, slightly diminished BS on R Wound: Clean and dry Chest tube: No air leak    Lab Results: CBC: Recent Labs  10/13/15 0414 10/14/15 0425  WBC 9.0 8.1  HGB 10.8* 10.4*  HCT 37.6 37.8  PLT 186 202   BMET:  Recent Labs  10/13/15 0414 10/14/15 0425  NA 135 138  K 4.2 3.8  CL 107 110  CO2 23 23  GLUCOSE 115* 94  BUN <5* <5*  CREATININE 0.52 0.48  CALCIUM 8.3* 8.2*    PT/INR: No results for input(s): LABPROT, INR in the last 72 hours.  CXR: FINDINGS: The lungs are mildly hypoinflated. A 10% or less right apical pneumothorax is visible. The right-sided chest tube tip projects in the interspace between the  posterior third and fourth ribs. There is no significant pleural effusion. The more medial chest tube on the right has been removed. On the left there is nos significant pleural effusion and no pneumothorax. The cardiac silhouette is mildly enlarged but stable. The pulmonary vascularity is not engorged. The right internal jugular venous catheter tip projects over the distal SVC. The bony thorax exhibits no acute abnormality.  IMPRESSION: 1. New 10% or less right apical pneumothorax. The remaining right chest tube is in reasonable position. 2. Stable cardiomegaly without pulmonary vascular congestion.   Assessment/Plan: S/P Procedure(s) (LRB): VIDEO ASSISTED THORACOSCOPY (Right) RIGHT UPPER LOBE SEGMENTECTOMY (Right) CT with no air leak and decreased drainage. CXR stable with small apical ptx. Hopefully can d/c CT today. Plan saline lock IVF, d/c PCA.  Continue ambulation, pulm toilet.   LOS: 2 days    COLLINS,GINA H 10/14/2015  Patient seen and examined, agree with above No air leak- dc CT Path still pending  Remo Lipps C. Roxan Hockey, MD [facility] (  [phone]  

## 2015-10-15 ENCOUNTER — Telehealth: Payer: Self-pay | Admitting: Pulmonary Disease

## 2015-10-15 ENCOUNTER — Inpatient Hospital Stay (HOSPITAL_COMMUNITY): Payer: 59

## 2015-10-15 MED ORDER — OXYCODONE HCL 5 MG PO TABS: 5.0000 mg | ORAL_TABLET | ORAL | Status: DC | PRN

## 2015-10-15 MED ORDER — CHLORPHENIRAMINE MALEATE 4 MG PO TABS: 12.0000 mg | ORAL_TABLET | Freq: Every day | ORAL | Status: AC

## 2015-10-15 NOTE — Progress Notes (Addendum)
       [address]       [place] [address]             [phone]          3 Days Post-Op Procedure(s) (LRB): VIDEO ASSISTED THORACOSCOPY (Right) RIGHT UPPER LOBE SEGMENTECTOMY (Right)  Subjective: Feels well, no complaints.   Objective: Vital signs in last 24 hours: Patient Vitals for the past 24 hrs:  BP Temp Temp src Pulse Resp SpO2  10/15/15 0525 110/75 mmHg - - 81 (!) 22 99 %  10/15/15 0500 - 98.5 F (36.9 C) Oral - - -  10/14/15 2329 124/64 mmHg 99 F (37.2 C) Oral 85 (!) 21 99 %  10/14/15 2129 - - - - - 98 %  10/14/15 1921 121/73 mmHg 98.7 F (37.1 C) Oral 98 (!) 25 97 %  10/14/15 1751 - 98.5 F (36.9 C) Oral - - -  10/14/15 1122 - 98.8 F (37.1 C) Oral (!) 104 - 97 %  10/14/15 0917 - - - - 18 99 %  10/14/15 0814 - - - - - 99 %   Current Weight  10/12/15 258 lb 6.1 oz (117.2 kg)     Intake/Output from previous day: 12/07 0701 - 12/08 0700 In: 240 [P.O.:240] Out: 100 [Chest Tube:100]    PHYSICAL EXAM:  Heart: RRR Lungs: Clear Wound: Clean and dry    Lab Results: CBC: Recent Labs  10/13/15 0414 10/14/15 0425  WBC 9.0 8.1  HGB 10.8* 10.4*  HCT 37.6 37.8  PLT 186 202   BMET:  Recent Labs  10/13/15 0414 10/14/15 0425  NA 135 138  K 4.2 3.8  CL 107 110  CO2 23 23  GLUCOSE 115* 94  BUN <5* <5*  CREATININE 0.52 0.48  CALCIUM 8.3* 8.2*    PT/INR: No results for input(s): LABPROT, INR in the last 72 hours.  Path: NEUROENDOCRINE TUMOR (ATYPICAL CARCINOID), 1.4 CM IN GREATEST DIMENSION THE TUMOR IS ORGAN CONFINED (T1A) BRANCHIAL, VASCULAR AND ALL RESECTION MARGINS ARE NEGATIVE FOR TUMOR (T1a, N1)  CXR: FINDINGS: Small right apical pneumothorax is stable. Continued mildly low lung volumes. No pulmonary edema or pleural effusion identified. Stable cardiac size and mediastinal contours. Visualized tracheal air column is within normal limits. Mild streaky left lower lobe opacity most resembles atelectasis. Stable  visualized osseous structures.  IMPRESSION: 1. Stable small right apical pneumothorax. 2. Mildly low lung volumes, left lung base atelectasis.   Assessment/Plan: S/P Procedure(s) (LRB): VIDEO ASSISTED THORACOSCOPY (Right) RIGHT UPPER LOBE SEGMENTECTOMY (Right) CXR with stable R apical ptx/space. Pt doing well otherwise.  Path as noted above. Plan home today- instructions reviewed with pt.   LOS: 3 days    COLLINS,GINA H 10/15/2015  Patient seen and examined, agree with above Path T1a,N1- stage IIA atypical carcinoid tumor- will need oncology consultation as an outpatient  Remo Lipps C. Roxan Hockey, MD [facility] [phone]

## 2015-10-15 NOTE — Telephone Encounter (Signed)
Pt had surgery on Monday on 12/5: Right video-assisted thoracoscopy, right upper lobe posterior segmentectomy, mediastinal lymph node dissection, On-Q local anesthetic catheter placement. ---  Pt is scheduled for spiro w/ bronchodilator on 12/13. Pt wants to know if she still needs this to be done? Please advise thanks

## 2015-10-15 NOTE — Progress Notes (Signed)
Discussed discharge instructions and medications with patient. Patient verbalized understanding with all questions answered. VSS. Pt discharged home with family.  [facility]

## 2015-10-16 ENCOUNTER — Other Ambulatory Visit: Payer: Self-pay | Admitting: Pulmonary Disease

## 2015-10-16 ENCOUNTER — Telehealth: Payer: Self-pay | Admitting: Pulmonary Disease

## 2015-10-16 DIAGNOSIS — C7A090 Malignant carcinoid tumor of the bronchus and lung: Secondary | ICD-10-CM

## 2015-10-16 NOTE — Telephone Encounter (Signed)
Called patient and notified her of path results.  Atypical Carcinoid with spread to 10R. Plan to refer patient to Medical Oncology but suspect no further treatment is necessary.

## 2015-10-16 NOTE — Telephone Encounter (Signed)
Waiting on JN rec

## 2015-10-16 NOTE — Telephone Encounter (Signed)
Called pt and informed of JN rec Pt voiced understanding  Nothing further is needed

## 2015-10-16 NOTE — Telephone Encounter (Signed)
Keep other December appt with me. JN

## 2015-10-16 NOTE — Telephone Encounter (Signed)
Given her resection PFT needs to be cancelled. JN

## 2015-10-16 NOTE — Telephone Encounter (Signed)
Spoke with pt and appt for PFT cancelled. She wants to know if she still needs to follow up as well. She has appt 12/13 and if not, when does she need to f/u?

## 2015-10-20 ENCOUNTER — Other Ambulatory Visit: Payer: PRIVATE HEALTH INSURANCE

## 2015-10-20 ENCOUNTER — Encounter: Payer: Self-pay | Admitting: Pulmonary Disease

## 2015-10-20 ENCOUNTER — Ambulatory Visit (INDEPENDENT_AMBULATORY_CARE_PROVIDER_SITE_OTHER): Payer: PRIVATE HEALTH INSURANCE | Admitting: Pulmonary Disease

## 2015-10-20 VITALS — BP 126/82 | HR 98 | Ht 69.0 in | Wt 248.0 lb

## 2015-10-20 DIAGNOSIS — Z23 Encounter for immunization: Secondary | ICD-10-CM

## 2015-10-20 DIAGNOSIS — K219 Gastro-esophageal reflux disease without esophagitis: Secondary | ICD-10-CM

## 2015-10-20 DIAGNOSIS — C3490 Malignant neoplasm of unspecified part of unspecified bronchus or lung: Secondary | ICD-10-CM

## 2015-10-20 DIAGNOSIS — J449 Chronic obstructive pulmonary disease, unspecified: Secondary | ICD-10-CM | POA: Diagnosis not present

## 2015-10-20 DIAGNOSIS — J45909 Unspecified asthma, uncomplicated: Secondary | ICD-10-CM

## 2015-10-20 NOTE — Progress Notes (Signed)
Subjective:    Patient ID: Anna Larson, female    DOB: May 06, 1966, 49 y.o.   MRN: [id number]  C.C.:  Follow-up for RUL Lung Nodule, Asthma/COPD overlap, & GERD.  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 Referral made to Medical Oncology.  Asthma/Moderate COPD Overlap: Alpha-1 antitrypsin level normal. She reports continued intermittent dyspnea. No wheezing. Intermittent coughing that is usually nonproductive. Compliant with Symbicort bid. No exacerbations since last appointment.  GERD: No reflux or dyspepsia. No morning brash water taste.  Review of Systems No chest pain or pressure. No fever, chills, or sweats.   Allergies  Allergen Reactions  . Codeine Rash    Current Outpatient Prescriptions on File Prior to Visit  Medication Sig Dispense Refill  . budesonide-formoterol (SYMBICORT) 80-4.5 MCG/ACT inhaler Inhale 2 puffs into the lungs 2 (two) times daily. 1 Inhaler 3  . chlorpheniramine (CHLOR-TRIMETON) 4 MG tablet Take 3 tablets (12 mg total) by mouth at bedtime. 90 tablet 2  . ferrous sulfate 325 (65 FE) MG tablet Take 1 tablet (325 mg total) by mouth 3 (three) times daily with meals. 90 tablet 3  . oxyCODONE (OXY IR/ROXICODONE) 5 MG immediate release tablet Take 1-2 tablets (5-10 mg total) by mouth every 4 (four) hours as needed for severe pain. 30 tablet 0   No current facility-administered medications on file prior to visit.    Past Medical History  Diagnosis Date  . Cough   . Asthma   . COPD (chronic obstructive pulmonary disease) ([place])   . GERD (gastroesophageal reflux disease)   . Lung nodule < 6cm on CT   . Allergic rhinitis   . Arthritis   . History of kidney stones     x 3 episodes  . Hx of adenomatous + sessile serrated colonic polyps 09/22/2015  . Pneumonia     hx of  . Anemia     Takes Ferrous sulfate     Past Surgical History  Procedure Laterality Date  . Tubal ligation    . Lithotripsy      x2  . Cesarean section    . Esophagogastroduodenoscopy (egd) with propofol N/A 09/22/2015    Procedure: ESOPHAGOGASTRODUODENOSCOPY (EGD) WITH PROPOFOL;  Surgeon: Gatha Mayer, MD;  Location: [facility];  Service: Endoscopy;  Laterality: N/A;  . Colonoscopy with propofol N/A 09/22/2015    Procedure: COLONOSCOPY WITH PROPOFOL;  Surgeon: Gatha Mayer, MD;  Location: [facility];  Service: Endoscopy;  Laterality: N/A;  . Video assisted thoracoscopy Right 10/12/2015    Procedure: VIDEO ASSISTED THORACOSCOPY;  Surgeon: [name], MD;  Location: [facility];  Service: Thoracic;  Laterality: Right;  . Segmentecomy Right 10/12/2015    Procedure: RIGHT UPPER LOBE SEGMENTECTOMY;  Surgeon: [name], MD;  Location: [facility];  Service: Thoracic;  Laterality: Right;    Family History  Problem Relation Age of Onset  . Bone cancer Maternal Grandmother   . Cancer Maternal Grandmother     bone  . Multiple sclerosis Mother   . Prostate cancer Paternal Grandfather   . GER disease Father   . Emphysema Maternal Grandfather   . Cancer Maternal Grandfather     emphysema    Social History   Social History  . Marital Status: Married    Spouse Name: N/A  . Number of Children: 2  . Years of Education: N/A  Occupational History  . [place] History Main Topics  . Smoking status: Passive Smoke Exposure - Never Smoker  . Smokeless tobacco: Never Used     Comment: both mothers smoked  . Alcohol Use: 0.0 oz/week    0 Standard drinks or equivalent per week     Comment: occasional  . Drug Use: No  . Sexual Activity: Not Asked   Other Topics Concern  . None   Social History Narrative   Originally from [place]. Has always lived in [place]. Has traveled to [facility]. No international travel. Works in the [employer] system in [employer]. Has an outdoor dog. No bird,  mold, or hot tube exposure. Enjoys cross stitching.       Objective:   Physical Exam BP 126/82 mmHg  Pulse 98  Ht '5\' 9"'$  (1.753 m)  Wt 248 lb (112.492 kg)  BMI 36.61 kg/m2  SpO2 98%  LMP 09/30/2015 General:  Central obesity noted. No distress. Appears comfortable.. Integument:  Warm & dry. No rash on exposed skin. Surgical incision sites on right thorax are clean, dry, & in tact. HEENT: Moist mucus membranes. No oral ulcers. No nasal turbinate swelling.  Cardiovascular:  Regular rate .Normal S1 & S2. No edemas.   Pulmonary:  Good aeration & clear to auscultation bilaterally.no accessory muscle use on room air.  Abdomen: Soft. Normal bowel sounds. Protuberant. Musculoskeletal:  Normal bulk and tone. No joint deformity or effusion appreciated.  PFT 08/26/15: FVC 3.09 L (78%) FEV1 2.07 L (66%) FEV1/FVC 0.67 FEF 25-75 1.86 L (62%) no bronchodilator response TLC 4.26 L (77%) RV 71% ERV 15% DLCO uncorrected 69% 04/04/13: FVC 3.20 L (80%) FEV1 2.50 L (77%) FEV1/FVC 0.78 FEF 25-75 2.46 L (75%)  IMAGING PET CT 08/10/15 (previously reviewed by me): Right upper lobe nodule with max SUV 3.8. No pathologic mediastinal or hilar adenopathy. Left-sided axillary lymph node with max SUV 2.7. Multiple small inguinal lymph nodes. Diffuse increased metabolic activity in the bony structures most notably the spine and pelvis as well as muscular activity.  PATHOLOGY RUL RESECTION & LYMPH NODE DISSECTION (10/12/15): Atypical carcinoid tumor 1.4 cm in greatest dimension. 10R lymph node with metastasis. Level 7 & 12R lymph nodes negative.  LABS 08/26/15 Alpha-1 antitrypsin:  173    Assessment & Plan:  49 year old Caucasian female previously diagnosed with cough variant asthma. patient does have moderate airways obstruction on spirometry. Seemed to recover well from her right upper lobe segmentectomy which has shown atypical carcinoid/non-small cell lung cancer. She has been referred to medical oncology and is  currently awaiting an appointment. I reviewed her prior serum lab work which showed no evidence of alpha-1 antitrypsin deficiency but a phenotype was not performed. Reflux seems to be well-controlled at this time. We are holding off on pulmonary function at this time given her recent thoracic surgery. She does have follow-up with thoracic surgery. I instructed the patient call my office if she had any new breathing problems before her next appointment.  i  1. Right upper lobe NSCLC/Atypical Carcinoid: Status post segmentectomy & lymph node dissection with metastasis to level 10R. referral placed to medical oncology for further assessment and treatment recommendations. 2. Asthma with moderate airways obstruction:  Continuing patient on Symbicort 80/4.5. No changes. Plan for full pulmonary function testing at follow-up appointment. 3. GERD:  Asymptomatic. 4. Health maintenance: Received influenza vaccine this fall. Received Pneumovax 09/23/13. Administering Prevnar vaccine today. 5. Follow-up:  Patient to return to clinic  in 3 months or sooner if needed.

## 2015-10-20 NOTE — Patient Instructions (Signed)
1. Continue using your Symbicort as prescribed. 2. We will check breathing tests at your next appointment. 3. Please call my office if you have any new breathing problems before your next appointment. Otherwise I will see back in 3 months.

## 2015-10-21 ENCOUNTER — Telehealth: Payer: Self-pay | Admitting: *Deleted

## 2015-10-21 ENCOUNTER — Encounter: Payer: Self-pay | Admitting: *Deleted

## 2015-10-21 DIAGNOSIS — C3491 Malignant neoplasm of unspecified part of right bronchus or lung: Secondary | ICD-10-CM

## 2015-10-21 NOTE — Telephone Encounter (Signed)
Oncology Nurse Navigator Documentation  Oncology Nurse Navigator Flowsheets 10/21/2015  Navigator Encounter Type Introductory phone call/I received referral.  I checked with Dr. Julien Nordmann about an appt.  I called and scheduled her to be seen on 10/28/15 arrive at 1:45. She verbalized understanding of appt time and place.   Patient Visit Type Initial  Treatment Phase Other  Interventions Coordination of Care  Coordination of Care MD Appointments  Time Spent with Patient 15

## 2015-10-23 ENCOUNTER — Encounter (HOSPITAL_COMMUNITY): Payer: Self-pay | Admitting: Thoracic Surgery (Cardiothoracic Vascular Surgery)

## 2015-10-23 ENCOUNTER — Encounter (INDEPENDENT_AMBULATORY_CARE_PROVIDER_SITE_OTHER): Payer: Self-pay

## 2015-10-23 ENCOUNTER — Other Ambulatory Visit: Payer: Self-pay | Admitting: *Deleted

## 2015-10-23 DIAGNOSIS — G8918 Other acute postprocedural pain: Secondary | ICD-10-CM

## 2015-10-23 DIAGNOSIS — C7A090 Malignant carcinoid tumor of the bronchus and lung: Secondary | ICD-10-CM

## 2015-10-23 MED ORDER — OXYCODONE HCL 5 MG PO TABS: 5.0000 mg | ORAL_TABLET | ORAL | Status: DC | PRN

## 2015-10-26 LAB — ALPHA-1 ANTITRYPSIN PHENOTYPE: A1 ANTITRYPSIN: 203 mg/dL — AB (ref 83–199)

## 2015-10-28 ENCOUNTER — Other Ambulatory Visit (HOSPITAL_BASED_OUTPATIENT_CLINIC_OR_DEPARTMENT_OTHER): Payer: PRIVATE HEALTH INSURANCE

## 2015-10-28 ENCOUNTER — Ambulatory Visit (HOSPITAL_BASED_OUTPATIENT_CLINIC_OR_DEPARTMENT_OTHER): Payer: PRIVATE HEALTH INSURANCE | Admitting: Internal Medicine

## 2015-10-28 ENCOUNTER — Other Ambulatory Visit: Payer: Self-pay | Admitting: Thoracic Surgery (Cardiothoracic Vascular Surgery)

## 2015-10-28 ENCOUNTER — Telehealth: Payer: Self-pay | Admitting: Internal Medicine

## 2015-10-28 ENCOUNTER — Encounter: Payer: Self-pay | Admitting: Internal Medicine

## 2015-10-28 VITALS — BP 126/78 | HR 97 | Temp 98.4°F | Resp 18 | Ht 69.0 in | Wt 256.4 lb

## 2015-10-28 DIAGNOSIS — C7A090 Malignant carcinoid tumor of the bronchus and lung: Secondary | ICD-10-CM

## 2015-10-28 DIAGNOSIS — C3491 Malignant neoplasm of unspecified part of right bronchus or lung: Secondary | ICD-10-CM

## 2015-10-28 DIAGNOSIS — C3490 Malignant neoplasm of unspecified part of unspecified bronchus or lung: Secondary | ICD-10-CM

## 2015-10-28 DIAGNOSIS — K219 Gastro-esophageal reflux disease without esophagitis: Secondary | ICD-10-CM

## 2015-10-28 DIAGNOSIS — J302 Other seasonal allergic rhinitis: Secondary | ICD-10-CM

## 2015-10-28 LAB — COMPREHENSIVE METABOLIC PANEL
ALBUMIN: 3.7 g/dL (ref 3.5–5.0)
ALK PHOS: 121 U/L (ref 40–150)
ALT: 14 U/L (ref 0–55)
AST: 12 U/L (ref 5–34)
Anion Gap: 10 mEq/L (ref 3–11)
BUN: 5.3 mg/dL — ABNORMAL LOW (ref 7.0–26.0)
CALCIUM: 9.3 mg/dL (ref 8.4–10.4)
CO2: 24 mEq/L (ref 22–29)
Chloride: 106 mEq/L (ref 98–109)
Creatinine: 0.8 mg/dL (ref 0.6–1.1)
EGFR: 89 mL/min/{1.73_m2} — AB (ref >90)
Glucose: 95 mg/dl (ref 70–140)
POTASSIUM: 3.9 meq/L (ref 3.5–5.1)
Sodium: 139 mEq/L (ref 136–145)
Total Bilirubin: <0.3 mg/dL (ref 0.20–1.20)
Total Protein: 7.2 g/dL (ref 6.4–8.3)

## 2015-10-28 LAB — CBC WITH DIFFERENTIAL/PLATELET
BASO%: 1.1 % (ref 0.0–2.0)
BASOS ABS: 0.1 10*3/uL (ref 0.0–0.1)
EOS%: 2.2 % (ref 0.0–7.0)
Eosinophils Absolute: 0.2 10*3/uL (ref 0.0–0.5)
HEMATOCRIT: 39.2 % (ref 34.8–46.6)
HEMOGLOBIN: 11.5 g/dL — AB (ref 11.6–15.9)
LYMPH#: 2.1 10*3/uL (ref 0.9–3.3)
LYMPH%: 28 % (ref 14.0–49.7)
MCH: 20.1 pg — AB (ref 25.1–34.0)
MCHC: 29.4 g/dL — ABNORMAL LOW (ref 31.5–36.0)
MCV: 68.5 fL — AB (ref 79.5–101.0)
MONO#: 0.6 10*3/uL (ref 0.1–0.9)
MONO%: 7.6 % (ref 0.0–14.0)
NEUT#: 4.5 10*3/uL (ref 1.5–6.5)
NEUT%: 61.1 % (ref 38.4–76.8)
Platelets: 299 10*3/uL (ref 145–400)
RBC: 5.72 10*6/uL — ABNORMAL HIGH (ref 3.70–5.45)
RDW: 24.9 % — ABNORMAL HIGH (ref 11.2–14.5)
WBC: 7.4 10*3/uL (ref 3.9–10.3)
nRBC: 0 % (ref 0–0)

## 2015-10-28 NOTE — Progress Notes (Signed)
[place] [phone]   [phone]  CONSULT NOTE  REFERRING PHYSICIAN: Dr. Modesto Charon  REASON FOR CONSULTATION:  49 years old white female recently diagnosed with lung cancer  HPI Anna Larson is a 49 y.o. female a never smoker with past medical history significant for asthma, allergic rhinitis, kidney stone, and GERD. The patient has been complaining of chronic cough for long time and in 2014 she was followed by Dr. Joya Gaskins and was found to have questionable nodule in the right upper lobe. This nodule was followed closely by observation. Repeat CT scan of the chest without contrast on 07/23/2015 showed 1.0 x 1.8 cm right upper lobe pulmonary nodule with slight increase in size when compared to the previous exams. There was no evidence of lymphadenopathy or pleural effusion. The patient had a PET scan performed on 09/06/2015 and it showed the 1.4 cm right upper lobe pulmonary lesion, hypermetabolic with SUV max of 3.8. There was no enlarged or hypermetabolic mediastinal or hilar lymph nodes. There was no other worrisome pulmonary lesions and no evidence of metastatic disease. There is a scattered small axillary and inguinal lymph nodes that were mildly hypermetabolic and may suggest inflammatory changes. The patient was referred to Dr. Roxan Hockey and on 10/12/2015 she underwent right vats, right upper lobe superior segmentectomy with mediastinal lymph node dissection. The final pathology (Accession: [id number]) showed neuroendocrine tumor (atypical carcinoid) measuring 1.4 cm in greatest dimension. The bronchial, vascular and all resection margins were negative for tumor. There was evidence for metastatic neuroendocrine tumor and one lymph node from level 10R. the final pathologic stage was pT1a , pN1.  Dr. Roxan Hockey kindly referred the patient to me today for evaluation and discussion of adjuvant treatment options if needed. When seen today the  patient is feeling fine except for pain at the surgical site in the right side of the chest as well as shortness breath with exertion and dry cough. She denied having any hemoptysis. The patient denied having any significant weight loss or night sweats. She has no nausea or vomiting. She has no headache or visual changes. Family history significant for a mother with multiple sclerosis, father with GERD. Maternal grandmother had bone cancer and paternal grandfather had prostate cancer. The patient is married and has 2 children. She works for [employer]. She was accompanied by her father and stepmother today. The patient has no history of smoking but drinks alcohol occasionally and no history of drug abuse.  HPI  Past Medical History  Diagnosis Date  . Cough   . Asthma   . COPD (chronic obstructive pulmonary disease) ([place])   . GERD (gastroesophageal reflux disease)   . Lung nodule < 6cm on CT   . Allergic rhinitis   . Arthritis   . History of kidney stones     x 3 episodes  . Hx of adenomatous + sessile serrated colonic polyps 09/22/2015  . Pneumonia     hx of  . Anemia     Takes Ferrous sulfate  . Primary atypical carcinoid tumor of right lung [facility])     Right upper lobe segmentectomy 2016- T1N1    Past Surgical History  Procedure Laterality Date  . Tubal ligation    . Lithotripsy      x2  . Cesarean section    . Esophagogastroduodenoscopy (egd) with propofol N/A 09/22/2015    Procedure: ESOPHAGOGASTRODUODENOSCOPY (EGD) WITH PROPOFOL;  Surgeon: Gatha Mayer, MD;  Location: [facility];  Service: Endoscopy;  Laterality: N/A;  . Colonoscopy with propofol N/A 09/22/2015    Procedure: COLONOSCOPY WITH PROPOFOL;  Surgeon: Gatha Mayer, MD;  Location: [facility];  Service: Endoscopy;  Laterality: N/A;  . Video assisted thoracoscopy Right 10/12/2015    Procedure: VIDEO ASSISTED THORACOSCOPY;  Surgeon: [name], MD;  Location: [place];  Service:  Thoracic;  Laterality: Right;  . Segmentecomy Right 10/12/2015    Procedure: RIGHT UPPER LOBE SEGMENTECTOMY;  Surgeon: [name], MD;  Location: [place];  Service: Thoracic;  Laterality: Right;    Family History  Problem Relation Age of Onset  . Bone cancer Maternal Grandmother   . Cancer Maternal Grandmother     bone  . Multiple sclerosis Mother   . Prostate cancer Paternal Grandfather   . GER disease Father   . Emphysema Maternal Grandfather   . Cancer Maternal Grandfather     emphysema    Social History Social History  Substance Use Topics  . Smoking status: Passive Smoke Exposure - Never Smoker  . Smokeless tobacco: Never Used     Comment: both mothers smoked  . Alcohol Use: 0.0 oz/week    0 Standard drinks or equivalent per week     Comment: occasional    Allergies  Allergen Reactions  . Codeine Rash    Current Outpatient Prescriptions  Medication Sig Dispense Refill  . budesonide-formoterol (SYMBICORT) 80-4.5 MCG/ACT inhaler Inhale 2 puffs into the lungs 2 (two) times daily. 1 Inhaler 3  . chlorpheniramine (CHLOR-TRIMETON) 4 MG tablet Take 3 tablets (12 mg total) by mouth at bedtime. 90 tablet 2  . oxyCODONE (OXY IR/ROXICODONE) 5 MG immediate release tablet Take 1-2 tablets (5-10 mg total) by mouth every 4 (four) hours as needed for severe pain. 30 tablet 0  . ferrous sulfate 325 (65 FE) MG tablet Take 1 tablet (325 mg total) by mouth 3 (three) times daily with meals. 90 tablet 3   No current facility-administered medications for this visit.    Review of Systems  Constitutional: negative Eyes: negative Ears, nose, mouth, throat, and face: negative Respiratory: positive for cough, dyspnea on exertion and pleurisy/chest pain Cardiovascular: negative Gastrointestinal: negative Genitourinary:negative Integument/breast: negative Hematologic/lymphatic: negative Musculoskeletal:negative Neurological: negative Behavioral/Psych: negative Endocrine:  negative Allergic/Immunologic: negative  Physical Exam  ZOX:WRUEA, healthy, no distress, well nourished and well developed SKIN: skin color, texture, turgor are normal, no rashes or significant lesions HEAD: Normocephalic, No masses, lesions, tenderness or abnormalities EYES: normal, PERRLA, Conjunctiva are pink and non-injected EARS: External ears normal, Canals clear OROPHARYNX:no exudate, no erythema and lips, buccal mucosa, and tongue normal  NECK: supple, no adenopathy, no JVD LYMPH:  no palpable lymphadenopathy, no hepatosplenomegaly BREAST:not examined LUNGS: clear to auscultation , and palpation HEART: regular rate & rhythm, no murmurs and no gallops ABDOMEN:abdomen soft, non-tender, obese, normal bowel sounds and no masses or organomegaly BACK: Back symmetric, no curvature., No CVA tenderness EXTREMITIES:no joint deformities, effusion, or inflammation  NEURO: alert & oriented x 3 with fluent speech, no focal motor/sensory deficits  PERFORMANCE STATUS: ECOG 1  LABORATORY DATA: Lab Results  Component Value Date   WBC 7.4 10/28/2015   HGB 11.5* 10/28/2015   HCT 39.2 10/28/2015   MCV 68.5* 10/28/2015   PLT 299 10/28/2015      Chemistry      Component Value Date/Time   NA 139 10/28/2015 1341   NA 138 10/14/2015 0425   K 3.9 10/28/2015 1341   K 3.8 10/14/2015 0425   CL  110 10/14/2015 0425   CO2 24 10/28/2015 1341   CO2 23 10/14/2015 0425   BUN 5.3* 10/28/2015 1341   BUN <5* 10/14/2015 0425   CREATININE 0.8 10/28/2015 1341   CREATININE 0.48 10/14/2015 0425      Component Value Date/Time   CALCIUM 9.3 10/28/2015 1341   CALCIUM 8.2* 10/14/2015 0425   ALKPHOS 121 10/28/2015 1341   ALKPHOS 83 10/14/2015 0425   AST 12 10/28/2015 1341   AST 15 10/14/2015 0425   ALT 14 10/28/2015 1341   ALT 14 10/14/2015 0425   BILITOT <0.30 10/28/2015 1341   BILITOT 0.4 10/14/2015 0425       RADIOGRAPHIC STUDIES: Dg Chest 2 View  10/15/2015  CLINICAL DATA:  49 year old  female status post VATS for non-small cell lung cancer on the right. Initial encounter. EXAM: CHEST  2 VIEW COMPARISON:  10/14/2015 and earlier. FINDINGS: Small right apical pneumothorax is stable. Continued mildly low lung volumes. No pulmonary edema or pleural effusion identified. Stable cardiac size and mediastinal contours. Visualized tracheal air column is within normal limits. Mild streaky left lower lobe opacity most resembles atelectasis. Stable visualized osseous structures. IMPRESSION: 1. Stable small right apical pneumothorax. 2. Mildly low lung volumes, left lung base atelectasis. Electronically Signed   By: Genevie Ann M.D.   On: 10/15/2015 07:51   Dg Chest 2 View  10/12/2015  CLINICAL DATA:  49 year old female with right lung nodule. EXAM: CHEST  2 VIEW COMPARISON:  Chest CT dated 08/31/2015 FINDINGS: Two views of the chest demonstrate the 1.7 cm ground-glass nodular density in the right upper lung field best seen on the frontal projection. The lungs are otherwise clear. No pleural effusion or pneumothorax. The cardiac silhouette is within normal limits. The osseous structures are grossly unremarkable. IMPRESSION: Right upper lobe nodule.  Continued follow-up recommended. Electronically Signed   By: Anner Crete M.D.   On: 10/12/2015 06:14   Dg Chest Port 1 View  10/14/2015  CLINICAL DATA:  Encounter for chest tube removal. Personal history of pneumothorax. EXAM: PORTABLE CHEST - 1 VIEW COMPARISON:  One-view chest x-ray from the same day. FINDINGS: The right IJ sheath a around right-sided chest tube have been removed. The heart is enlarged. Perihilar opacities are present bilaterally. Right hilar sutures are noted. A right apical pneumothorax is slightly larger than on the prior study, measuring approximately 10%. Left basilar opacities have slightly increased. The visualized soft tissues and bony thorax are unremarkable. IMPRESSION: 1. Slight increase in approximately 10% right apical  pneumothorax. 2. Interval removal of right-sided chest tube and right IJ sheath. 3. Postsurgical changes of the right hilum. 4. Cardiomegaly and mild pulmonary vascular congestion. 5. Increasing atelectasis at the left base. These results were called by telephone at the time of interpretation on 10/14/2015 at 1:18 pm to Dr. Ellwood Handler , who verbally acknowledged these results. Electronically Signed   By: San Morelle M.D.   On: 10/14/2015 13:29   Dg Chest Port 1 View  10/14/2015  CLINICAL DATA:  Status post right-sided segmentectomy on October 12, 2015 for malignancy. EXAM: PORTABLE CHEST 1 VIEW COMPARISON:  Portable chest x-ray of October 13, 2015 FINDINGS: The lungs are mildly hypoinflated. A 10% or less right apical pneumothorax is visible. The right-sided chest tube tip projects in the interspace between the posterior third and fourth ribs. There is no significant pleural effusion. The more medial chest tube on the right has been removed. On the left there is nos significant pleural effusion and no  pneumothorax. The cardiac silhouette is mildly enlarged but stable. The pulmonary vascularity is not engorged. The right internal jugular venous catheter tip projects over the distal SVC. The bony thorax exhibits no acute abnormality. IMPRESSION: 1. New 10% or less right apical pneumothorax. The remaining right chest tube is in reasonable position. 2. Stable cardiomegaly without pulmonary vascular congestion. 3. These results were called by telephone at the time of interpretation on 10/14/2015 at 7:36 am to Dr. Suzzanne Cloud , who verbally acknowledged these results. These results were called by telephone at the time of interpretation on 10/14/2015 at 7:36 am to Drema Dallas, RN., who verbally acknowledged these results. Electronically Signed   By: David  Martinique M.D.   On: 10/14/2015 07:37   Dg Chest Port 1 View  10/13/2015  CLINICAL DATA:  49 year old female with right lung non-small cell carcinoma  status post VATS, right upper lobe posterior segmentectomy, mediastinal lymph node dissection. Postop day 1. Initial encounter. EXAM: PORTABLE CHEST 1 VIEW COMPARISON:  10/12/2015 and earlier. FINDINGS: Portable AP semi upright view at 0623 hours. Stable right IJ central line. Two right chest tubes are stable. Lower lung volumes. No pneumothorax. Stable cardiac size and mediastinal contours. No pulmonary edema. No consolidation or definite effusion. Patchy right perihilar opacity is postoperative. IMPRESSION: 1.  Stable lines and tubes. 2. Lower lung volumes. No pneumothorax or new cardiopulmonary abnormality. Electronically Signed   By: Genevie Ann M.D.   On: 10/13/2015 07:50   Dg Chest Port 1 View  10/12/2015  CLINICAL DATA:  Post VATS, non small cell lung carcinoma EXAM: PORTABLE CHEST 1 VIEW COMPARISON:  10/12/2015 FINDINGS: Two right-sided chest tubes are noted. Cardiac size is stable. Postsurgical changes are noted right hilum and right suprahilar region. No pneumothorax. There is right IJ central line with tip in SVC right atrium junction. No acute infiltrate or pulmonary edema. IMPRESSION: Right side chest tubes in place. No pneumothorax. Postsurgical changes right hilum and right suprahilar region. No infiltrate or pulmonary edema. Electronically Signed   By: Lahoma Crocker M.D.   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 Roxan Hockey.   PLAN: I had a lengthy discussion with the patient and her family today about her current disease stage, prognosis and treatment options. I explained to the patient that there is no clear data about adjuvant chemotherapy for atypical carcinoid but with a stage IIA disease there may be a benefit  for adjuvant systemic chemotherapy with 4 cycles of platinum-based regimen. I also discussed with the patient continuous observation and close monitoring. We discussed the 2 options in details and the patient and her family would like to continue on observation for now I will see her back for follow-up visit in 6 months with repeat CT scan of the chest for restaging of her disease. The patient was advised to call immediately if she has any concerning symptoms in the interval. The patient voices understanding of current disease status and treatment options and is in agreement with the current care plan.  All questions were answered. The patient knows to call the clinic with any problems, questions or concerns. We can certainly see the patient much sooner if necessary.  Thank you so much for allowing me to participate  in the care of Anna Larson. I will continue to follow up the patient with you and assist in her care.  I spent 40 minutes counseling the patient face to face. The total time spent in the appointment was 55 minutes.  Disclaimer: This note was dictated with voice recognition software. Similar sounding words can inadvertently be transcribed and may not be corrected upon review.   Maresa Morash K. October 28, 2015, 4:52 PM

## 2015-10-28 NOTE — Telephone Encounter (Signed)
Gave pt appts for 6/20 + 6/27 and advised pt central scheduling will call to make appt for CT-Chest. Pt verbalized understanding.

## 2015-11-03 ENCOUNTER — Ambulatory Visit
Admission: RE | Admit: 2015-11-03 | Discharge: 2015-11-03 | Disposition: A | Discharge status: Home / Self Care | Payer: PRIVATE HEALTH INSURANCE | Source: Ambulatory Visit | Attending: Thoracic Surgery (Cardiothoracic Vascular Surgery) | Admitting: Thoracic Surgery (Cardiothoracic Vascular Surgery)

## 2015-11-03 ENCOUNTER — Ambulatory Visit (INDEPENDENT_AMBULATORY_CARE_PROVIDER_SITE_OTHER): Payer: Self-pay | Admitting: Thoracic Surgery (Cardiothoracic Vascular Surgery)

## 2015-11-03 ENCOUNTER — Encounter: Payer: Self-pay | Admitting: Thoracic Surgery (Cardiothoracic Vascular Surgery)

## 2015-11-03 VITALS — BP 116/80 | HR 70 | Resp 20 | Ht 69.0 in | Wt 256.0 lb

## 2015-11-03 DIAGNOSIS — C3491 Malignant neoplasm of unspecified part of right bronchus or lung: Secondary | ICD-10-CM

## 2015-11-03 DIAGNOSIS — C3490 Malignant neoplasm of unspecified part of unspecified bronchus or lung: Secondary | ICD-10-CM

## 2015-11-03 DIAGNOSIS — G8918 Other acute postprocedural pain: Secondary | ICD-10-CM

## 2015-11-03 MED ORDER — OXYCODONE HCL 5 MG PO TABS: 5.0000 mg | ORAL_TABLET | ORAL | Status: DC | PRN

## 2015-11-03 NOTE — Progress Notes (Signed)
[address]       [place] [address]             [phone]       HPI: Anna Larson returns today for scheduled postoperative follow-up visit.  She is a 49 year old woman who was found to have a lung nodule back in 2014 and she is being worked up for asthma. This was followed over time and eventually it showed an increase in size and she was referred for surgical resection. I did a thoracoscopic right upper lobe posterior segmentectomy on her on October 12, 2015. Her postoperative course was complicated.  Her pathology did come back as an atypical carcinoid tumor and there was involvement of 1 lymph node. Her final stage was T1a, N1, stage IIA. She saw Dr. Julien Nordmann but opted not to have adjuvant chemotherapy.  She still has some incisional discomfort. The majority of the time this is controlled with Tylenol but every once in a while she has taken oxycodone. She is requesting a refill of that medication. She has started driving without any issues. She has not had any problems with her breathing or exacerbations of her asthma.  Past Medical History  Diagnosis Date  . Cough   . Asthma   . COPD (chronic obstructive pulmonary disease) ([place])   . GERD (gastroesophageal reflux disease)   . Lung nodule < 6cm on CT   . Allergic rhinitis   . Arthritis   . History of kidney stones     x 3 episodes  . Hx of adenomatous + sessile serrated colonic polyps 09/22/2015  . Pneumonia     hx of  . Anemia     Takes Ferrous sulfate  . Primary atypical carcinoid tumor of right lung [facility])     Right upper lobe segmentectomy 2016- T1N1      Current Outpatient Prescriptions  Medication Sig Dispense Refill  . budesonide-formoterol (SYMBICORT) 80-4.5 MCG/ACT inhaler Inhale 2 puffs into the lungs 2 (two) times daily. 1 Inhaler 3  . chlorpheniramine (CHLOR-TRIMETON) 4 MG tablet Take 3 tablets (12 mg total) by mouth at bedtime. 90 tablet 2  . ferrous sulfate 325 (65 FE) MG tablet Take  1 tablet (325 mg total) by mouth 3 (three) times daily with meals. 90 tablet 3  . oxyCODONE (OXY IR/ROXICODONE) 5 MG immediate release tablet Take 1-2 tablets (5-10 mg total) by mouth every 4 (four) hours as needed for severe pain. 30 tablet 0   No current facility-administered medications for this visit.    Physical Exam BP 116/80 mmHg  Pulse 70  Resp 20  Ht '5\' 9"'$  (1.753 m)  Wt 256 lb (116.121 kg)  BMI 37.79 kg/m2  SpO2 97%  LMP 10/13/2015 Obese 49 year old woman in no acute distress Alert and oriented 3 with no focal deficits Lungs clear with equal size bilaterally, no wheezing Cardiac regular rate and rhythm normal S1 and S2 No edema  Diagnostic Tests: I personally reviewed her chest x-ray. It shows good aeration with no significant effusion.  Impression: 49 year old woman with a stage IIA atypical carcinoid tumor of the right upper lobe. She is now about 3 weeks out from surgery and is doing well.  She may drive, as she has already done. Appropriate precautions were discussed regarding speed and traffic. She was cautioned to builds back into her normal activities gradually.  She should be able to return to work by the end of February. If she feels ready before that,  she will call us and we will discuss whether that is reasonable at that time.  Dr. Julien Nordmann to schedule her for a CT scan in 6 months.   I gave her another prescription for oxycodone 5-10 mg by mouth 4 times a day as needed for pain, dispense 30 tablets, no refills.  Plan:  Return in 6 months after CT chest   [name], MD [facility] [phone]

## 2015-11-19 ENCOUNTER — Telehealth: Payer: Self-pay | Admitting: Medical Oncology

## 2015-11-19 NOTE — Telephone Encounter (Signed)
Med records sent

## 2016-01-20 ENCOUNTER — Ambulatory Visit (INDEPENDENT_AMBULATORY_CARE_PROVIDER_SITE_OTHER): Payer: PRIVATE HEALTH INSURANCE | Admitting: Pulmonary Disease

## 2016-01-20 ENCOUNTER — Encounter: Payer: Self-pay | Admitting: Pulmonary Disease

## 2016-01-20 VITALS — BP 132/88 | HR 76 | Ht 69.0 in | Wt 266.0 lb

## 2016-01-20 DIAGNOSIS — K219 Gastro-esophageal reflux disease without esophagitis: Secondary | ICD-10-CM | POA: Diagnosis not present

## 2016-01-20 DIAGNOSIS — J4520 Mild intermittent asthma, uncomplicated: Secondary | ICD-10-CM

## 2016-01-20 DIAGNOSIS — J449 Chronic obstructive pulmonary disease, unspecified: Secondary | ICD-10-CM | POA: Diagnosis not present

## 2016-01-20 DIAGNOSIS — J45909 Unspecified asthma, uncomplicated: Secondary | ICD-10-CM

## 2016-01-20 DIAGNOSIS — C3490 Malignant neoplasm of unspecified part of unspecified bronchus or lung: Secondary | ICD-10-CM | POA: Diagnosis not present

## 2016-01-20 LAB — PULMONARY FUNCTION TEST
DL/VA % PRED: 109 %
DL/VA: 5.64 ml/min/mmHg/L
DLCO COR: 23.9 ml/min/mmHg
DLCO cor % pred: 84 %
DLCO unc % pred: 86 %
DLCO unc: 24.61 ml/min/mmHg
FEF 25-75 Post: 2.6 L/sec
FEF 25-75 Pre: 2.4 L/sec
FEF2575-%CHANGE-POST: 8 %
FEF2575-%PRED-POST: 87 %
FEF2575-%PRED-PRE: 80 %
FEV1-%Change-Post: 1 %
FEV1-%PRED-PRE: 81 %
FEV1-%Pred-Post: 82 %
FEV1-Post: 2.58 L
FEV1-Pre: 2.53 L
FEV1FVC-%CHANGE-POST: 3 %
FEV1FVC-%Pred-Pre: 99 %
FEV6-%Change-Post: -1 %
FEV6-%PRED-PRE: 82 %
FEV6-%Pred-Post: 81 %
FEV6-Post: 3.1 L
FEV6-Pre: 3.16 L
FEV6FVC-%Change-Post: 0 %
FEV6FVC-%Pred-Post: 103 %
FEV6FVC-%Pred-Pre: 102 %
FVC-%Change-Post: -1 %
FVC-%PRED-PRE: 80 %
FVC-%Pred-Post: 78 %
FVC-POST: 3.1 L
FVC-PRE: 3.16 L
POST FEV1/FVC RATIO: 83 %
POST FEV6/FVC RATIO: 100 %
PRE FEV1/FVC RATIO: 80 %
Pre FEV6/FVC Ratio: 100 %
RV % pred: 80 %
RV: 1.54 L
TLC % pred: 84 %
TLC: 4.66 L

## 2016-01-20 NOTE — Progress Notes (Signed)
PFT done today. 

## 2016-01-20 NOTE — Patient Instructions (Signed)
   Stop using her Symbicort. If you start to have any new breathing problems go back to using it and contact me.  You can also stop using your allergy medicine.  We will perform breathing tests at your next appointment  I will see you back in 6 months or sooner if needed.  Please remember you're supposed to have a CT scan of your chest in June along with for follow-up with Dr. Julien Nordmann  TESTS ORDERED: 1. Spirometry with bronchodilator challenge at next appointment

## 2016-01-20 NOTE — Progress Notes (Signed)
Subjective:    Patient ID: Anna Larson, female    DOB: 26-Apr-1966, 50 y.o.   MRN: [id number]  C.C.:  Follow-up for Right Upper Lobe NSCLC (Atypical Carcinoid), Asthma/COPD overlap, & GERD.  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 Patient s/p surgical resection/RUL segmentectomy on 10/12/15 which showed atypical carcinoid tumor 1.4 cm in greatest dimension with metastasis to level 10R lymph node. Has seen Dr. Julien Nordmann. Patient planned for continued observation with repeat CT scan and 6 month follow-up in his office.  Asthma/Moderate COPD Overlap: She reports she is intermittently using her Symbicort. She reports no change in her breathing. She isn't sure if there is a seasonal component to any of her breathing problems. Denies any coughing or wheezing.   GERD: No nausea, emesis, or diarrhea. No reflux or dyspepsia. No abdominal pain.   Review of Systems No chest pain or tightness. No fever, chills, or sweats. Rare headaches. No focal weakness, numbness, or tingling.   Allergies  Allergen Reactions  . Codeine Rash    Current Outpatient Prescriptions on File Prior to Visit  Medication Sig Dispense Refill  . budesonide-formoterol (SYMBICORT) 80-4.5 MCG/ACT inhaler Inhale 2 puffs into the lungs 2 (two) times daily. 1 Inhaler 3  . chlorpheniramine (CHLOR-TRIMETON) 4 MG tablet Take 3 tablets (12 mg total) by mouth at bedtime. 90 tablet 2  . ferrous sulfate 325 (65 FE) MG tablet Take 1 tablet (325 mg total) by mouth 3 (three) times daily with meals. 90 tablet 3  . oxyCODONE (OXY IR/ROXICODONE) 5 MG immediate release tablet Take 1-2 tablets (5-10 mg total) by mouth every 4 (four) hours as needed for severe pain. 30 tablet 0   No current facility-administered medications on file prior to visit.    Past Medical History  Diagnosis Date  . Cough   . Asthma   . COPD (chronic obstructive pulmonary disease) ([place])   . GERD (gastroesophageal reflux disease)   . Lung nodule <  6cm on CT   . Allergic rhinitis   . Arthritis   . History of kidney stones     x 3 episodes  . Hx of adenomatous + sessile serrated colonic polyps 09/22/2015  . Pneumonia     hx of  . Anemia     Takes Ferrous sulfate  . Primary atypical carcinoid tumor of right lung [facility])     Right upper lobe segmentectomy 2016- T1N1    Past Surgical History  Procedure Laterality Date  . Tubal ligation    . Lithotripsy      x2  . Cesarean section    . Esophagogastroduodenoscopy (egd) with propofol N/A 09/22/2015    Procedure: ESOPHAGOGASTRODUODENOSCOPY (EGD) WITH PROPOFOL;  Surgeon: Gatha Mayer, MD;  Location: [facility];  Service: Endoscopy;  Laterality: N/A;  . Colonoscopy with propofol N/A 09/22/2015    Procedure: COLONOSCOPY WITH PROPOFOL;  Surgeon: Gatha Mayer, MD;  [facility];  Service: Endoscopy;  Laterality: N/A;  . Video assisted thoracoscopy Right 10/12/2015    Procedure: VIDEO ASSISTED THORACOSCOPY;  Surgeon: [name], MD;  Location: [place];  Service: Thoracic;  Laterality: Right;  . Segmentecomy Right 10/12/2015    Procedure: RIGHT UPPER LOBE SEGMENTECTOMY;  Surgeon: [name], MD;  Location: [place];  Service: Thoracic;  Laterality: Right;    Family History  Problem Relation Age of Onset  . Bone cancer Maternal Grandmother   . Cancer Maternal Grandmother     bone  . Multiple sclerosis  Mother   . Prostate cancer Paternal Grandfather   . GER disease Father   . Emphysema Maternal Grandfather   . Cancer Maternal Grandfather     emphysema    Social History   Social History  . Marital Status: Married    Spouse Name: N/A  . Number of Children: 2  . Years of Education: N/A   Occupational History  . [place] History Main Topics  . Smoking status: Passive Smoke Exposure - Never Smoker  . Smokeless tobacco: Never Used     Comment: both mothers smoked  . Alcohol Use: 0.0 oz/week    0 Standard drinks or  equivalent per week     Comment: occasional  . Drug Use: No  . Sexual Activity: Not Asked   Other Topics Concern  . None   Social History Narrative   Originally from [place]. Has always lived in [place]. Has traveled to [facility]. No international travel. Works in the [employer] system in [employer]. Has an outdoor dog. No bird, mold, or hot tube exposure. Enjoys cross stitching.       Objective:   Physical Exam BP 132/88 mmHg  Pulse 76  Ht '5\' 9"'$  (1.753 m)  Wt 266 lb (120.657 kg)  BMI 39.26 kg/m2  SpO2 98% General:  Central obesity noted. No distress. Awake. Integument:  Warm & dry. No rash on exposed skin.  HEENT: Moist mucus membranes. No oral ulcers. No nasal turbinate swelling.  Cardiovascular:  Regular rate. Normal S1 & S2. No edema.   Pulmonary:  Clear bilaterally to auscultation. Normal work of breathing on room air.  Abdomen: Soft. Normal bowel sounds. Protuberant. Musculoskeletal:  Normal bulk and tone. No joint deformity or effusion appreciated.  PFT 01/20/16: FVC 3.16 L (80%) FEV1 2.53 L (81%) FEV1/FVC 0.80 FEF 25-75 2.40 L (80%) no bronchodilator response TLC 4.66 L (84%) RV 80% ERV 27% DLCO corrected 84% 08/26/15: FVC 3.09 L (78%) FEV1 2.07 L (66%) FEV1/FVC 0.67 FEF 25-75 1.86 L (62%) no bronchodilator response TLC 4.26 L (77%) RV 71% ERV 15% DLCO uncorrected 69% 04/04/13: FVC 3.20 L (80%) FEV1 2.50 L (77%) FEV1/FVC 0.78 FEF 25-75 2.46 L (75%)  IMAGING PET CT 08/10/15 (previously reviewed by me): Right upper lobe nodule with max SUV 3.8. No pathologic mediastinal or hilar adenopathy. Left-sided axillary lymph node with max SUV 2.7. Multiple small inguinal lymph nodes. Diffuse increased metabolic activity in the bony structures most notably the spine and pelvis as well as muscular activity.  PATHOLOGY RUL RESECTION & LYMPH NODE DISSECTION (10/12/15): Atypical carcinoid tumor 1.4 cm in greatest dimension. 10R lymph node with metastasis. Level 7 & 12R lymph nodes  negative.  LABS 09/08/15 ANA:  Negative Anti-CCP:  <16 SSA:  <1.0 SSB:  <1.0  08/26/15 Alpha-1 antitrypsin:  173  09/23/13 IgE:  2.1 RAST Panel:  Negative     Assessment & Plan:  50 year old Caucasian female with what seems to be underlying mild, intermittent asthma. Patient has no symptoms at this time and given lack of symptomatic benefit from Symbicort usage along with resolution of her previously noted airway obstruction I feel that discontinuing Symbicort at least for the short-term is reasonable. The patient does have follow-up with medical oncology in June with plans for repeat CT imaging at that time to ensure her non-small cell lung cancer does not progress. I instructed the patient to contact my office if she had any new breathing problems before her next appointment  as I would be happy to see her sooner.  1. Mild, Intermittent Asthma:  Stopping Symbicort. Checking spirometry with bronchodilator challenge and next appointment to ensure maximal bronchodilatation. Also stopping and history medication. 2. Stage IIA NSCLC:  Patient has follow-up with medical oncology in June and plans for repeat CT imaging at that time. 3. Health maintenance: Received influenza vaccine this fall. Received Pneumovax 09/23/13. Administering Prevnar vaccine today. 4. Follow-up:  Patient to return to clinic in 6 months or sooner if needed.  Sonia Baller Ashok Cordia, M.D. [facility] Pulmonary & Critical Care Pager:  [phone] After 3pm or if no response, call [phone] 5:03 PM 01/20/2016

## 2016-04-12 ENCOUNTER — Telehealth: Payer: Self-pay | Admitting: Internal Medicine

## 2016-04-12 NOTE — Telephone Encounter (Signed)
Pt called regarding obtaining records for insurance.  Past dates of when we faxed info was provided and confirmed by HIM.  Pt agreed to pick up papers faxed and forward to insurance company. Documents were prepared and ready for pickup

## 2016-04-26 ENCOUNTER — Other Ambulatory Visit: Payer: PRIVATE HEALTH INSURANCE

## 2016-04-26 ENCOUNTER — Ambulatory Visit (HOSPITAL_COMMUNITY)
Admission: RE | Admit: 2016-04-26 | Discharge: 2016-04-26 | Disposition: A | Discharge status: Home / Self Care | Payer: Private Health Insurance - Indemnity | Source: Ambulatory Visit | Attending: Internal Medicine | Admitting: Internal Medicine

## 2016-04-26 ENCOUNTER — Other Ambulatory Visit (HOSPITAL_BASED_OUTPATIENT_CLINIC_OR_DEPARTMENT_OTHER): Payer: PRIVATE HEALTH INSURANCE

## 2016-04-26 ENCOUNTER — Encounter (HOSPITAL_COMMUNITY): Payer: Self-pay

## 2016-04-26 DIAGNOSIS — Z902 Acquired absence of lung [part of]: Secondary | ICD-10-CM | POA: Insufficient documentation

## 2016-04-26 DIAGNOSIS — C7A090 Malignant carcinoid tumor of the bronchus and lung: Secondary | ICD-10-CM

## 2016-04-26 DIAGNOSIS — R918 Other nonspecific abnormal finding of lung field: Secondary | ICD-10-CM | POA: Insufficient documentation

## 2016-04-26 LAB — COMPREHENSIVE METABOLIC PANEL
ALBUMIN: 3.5 g/dL (ref 3.5–5.0)
ALT: 16 U/L (ref 0–55)
ANION GAP: 7 meq/L (ref 3–11)
AST: 12 U/L (ref 5–34)
Alkaline Phosphatase: 104 U/L (ref 40–150)
BILIRUBIN TOTAL: 0.39 mg/dL (ref 0.20–1.20)
BUN: 5.7 mg/dL — ABNORMAL LOW (ref 7.0–26.0)
CALCIUM: 8.6 mg/dL (ref 8.4–10.4)
CO2: 25 meq/L (ref 22–29)
CREATININE: 0.7 mg/dL (ref 0.6–1.1)
Chloride: 108 mEq/L (ref 98–109)
EGFR: >90 mL/min/{1.73_m2} (ref >90)
Glucose: 93 mg/dl (ref 70–140)
Potassium: 3.9 mEq/L (ref 3.5–5.1)
Sodium: 140 mEq/L (ref 136–145)
TOTAL PROTEIN: 6.5 g/dL (ref 6.4–8.3)

## 2016-04-26 LAB — CBC WITH DIFFERENTIAL/PLATELET
BASO%: 0.7 % (ref 0.0–2.0)
BASOS ABS: 0 10*3/uL (ref 0.0–0.1)
EOS%: 0.8 % (ref 0.0–7.0)
Eosinophils Absolute: 0 10*3/uL (ref 0.0–0.5)
HCT: 42.8 % (ref 34.8–46.6)
HEMOGLOBIN: 13.5 g/dL (ref 11.6–15.9)
LYMPH%: 33.2 % (ref 14.0–49.7)
MCH: 25.2 pg (ref 25.1–34.0)
MCHC: 31.6 g/dL (ref 31.5–36.0)
MCV: 79.6 fL (ref 79.5–101.0)
MONO#: 0.4 10*3/uL (ref 0.1–0.9)
MONO%: 8 % (ref 0.0–14.0)
NEUT#: 2.7 10*3/uL (ref 1.5–6.5)
NEUT%: 57.3 % (ref 38.4–76.8)
Platelets: 186 10*3/uL (ref 145–400)
RBC: 5.37 10*6/uL (ref 3.70–5.45)
RDW: 14.6 % — AB (ref 11.2–14.5)
WBC: 4.6 10*3/uL (ref 3.9–10.3)
lymph#: 1.5 10*3/uL (ref 0.9–3.3)

## 2016-04-26 MED ORDER — IOPAMIDOL (ISOVUE-300) INJECTION 61%
75.0000 mL | Freq: Once | INTRAVENOUS | Status: AC | PRN
  Administered 2016-04-26: 75 mL via INTRAVENOUS

## 2016-04-29 ENCOUNTER — Telehealth: Payer: Self-pay | Admitting: Internal Medicine

## 2016-04-29 NOTE — Telephone Encounter (Signed)
S/w pt advised 6/27 appt chg to 7/3 due to md pal. Pt states she will be out of town and request another appt date. Gave 7/31 @ 3.15pm.

## 2016-05-02 ENCOUNTER — Telehealth: Payer: Self-pay | Admitting: Pulmonary Disease

## 2016-05-02 MED ORDER — FERROUS SULFATE 325 (65 FE) MG PO TABS: 325.0000 mg | ORAL_TABLET | Freq: Three times a day (TID) | ORAL | Status: DC

## 2016-05-02 NOTE — Telephone Encounter (Signed)
Spoke with pt. She needs a refill on ferrous sulfate. This has been sent in. Nothing further was needed.

## 2016-05-03 ENCOUNTER — Encounter: Payer: Self-pay | Admitting: Thoracic Surgery (Cardiothoracic Vascular Surgery)

## 2016-05-03 ENCOUNTER — Ambulatory Visit (INDEPENDENT_AMBULATORY_CARE_PROVIDER_SITE_OTHER): Payer: PRIVATE HEALTH INSURANCE | Admitting: Thoracic Surgery (Cardiothoracic Vascular Surgery)

## 2016-05-03 ENCOUNTER — Ambulatory Visit: Payer: PRIVATE HEALTH INSURANCE | Admitting: Internal Medicine

## 2016-05-03 VITALS — BP 128/80 | HR 84 | Resp 20 | Ht 69.0 in | Wt 260.0 lb

## 2016-05-03 DIAGNOSIS — C3491 Malignant neoplasm of unspecified part of right bronchus or lung: Secondary | ICD-10-CM | POA: Diagnosis not present

## 2016-05-03 NOTE — Progress Notes (Signed)
[address]       [place] [address]             [phone]       HPI: Anna Larson returns today for a scheduled 6 month follow-up visit.  She is a 50 year old woman who was initially found of a lung nodule back in 2014. This was followed initially, but increased in size over time. I did a right upper lobe posterior segmentectomy in December 2016. It turned out to be a T1 N1 stage IIA carcinoid tumor. She saw Dr. Julien Larson who recommended adjuvant therapy. She refuses that.  She still has some discomfort related to her incision. She also complains of some numbness around the incisions. It hurts most when her bra rubs on the chest tube incisions. She has not had any problems with shortness of breath recently.  Past Medical History  Diagnosis Date  . Cough   . Asthma   . COPD (chronic obstructive pulmonary disease) ([place])   . GERD (gastroesophageal reflux disease)   . Lung nodule < 6cm on CT   . Allergic rhinitis   . Arthritis   . History of kidney stones     x 3 episodes  . Hx of adenomatous + sessile serrated colonic polyps 09/22/2015  . Pneumonia     hx of  . Anemia     Takes Ferrous sulfate  . Primary atypical carcinoid tumor of right lung [facility])     Right upper lobe segmentectomy 2016- T1N1      Current Outpatient Prescriptions  Medication Sig Dispense Refill  . chlorpheniramine (CHLOR-TRIMETON) 4 MG tablet Take 3 tablets (12 mg total) by mouth at bedtime. 90 tablet 2  . ferrous sulfate 325 (65 FE) MG tablet Take 1 tablet (325 mg total) by mouth 3 (three) times daily with meals. 90 tablet 2  . oxyCODONE (OXY IR/ROXICODONE) 5 MG immediate release tablet Take 1-2 tablets (5-10 mg total) by mouth every 4 (four) hours as needed for severe pain. 30 tablet 0   No current facility-administered medications for this visit.    Physical Exam BP 128/80 mmHg  Pulse 84  Resp 20  Ht '5\' 9"'$  (1.753 m)  Wt 260 lb (117.935 kg)  BMI 38.38 kg/m2  SpO2 98%  LMP  04/13/2016 Obese 50 year old woman in no acute distress Alert and oriented 3 with no focal deficits No cervical or supraclavicular adenopathy Lungs clear with equal results bilaterally Incisions well healed  Diagnostic Tests: CT CHEST WITH CONTRAST  TECHNIQUE: Multidetector CT imaging of the chest was performed during intravenous contrast administration.  CONTRAST: 10m ISOVUE-300 IOPAMIDOL (ISOVUE-300) INJECTION 61%  COMPARISON: PET-CT from 08/31/2015  FINDINGS: Mediastinum/Lymph Nodes: The heart size appears mildly enlarged. There is no pericardial effusion. Normal appearance of the esophagus. No mediastinal or hilar adenopathy identified. No axillary or supraclavicular adenopathy.  Lungs/Pleura: No pleural fluid. Status post partial right upper lobectomy. Nonspecific soft tissue thickening along the suture line is identified, image 43 of series 2. This measures 1.5 x 1.3 x 1.2 cm, image 45 of series 603 and image 43 of series 2. No new pulmonary nodules identified.  Upper abdomen: The visualized portions of the liver and spleen appear normal. The adrenal glands are unremarkable. The visualized portions scratch set bilateral lobular kidneys noted.  Musculoskeletal: No aggressive lytic or sclerotic bone lesions identified.  IMPRESSION: 1. No acute findings identified. No complications status post partial right upper lobectomy. 2. Nonspecific soft tissue and thickening  along the right lung suture line is identified as above. This likely reflects postoperative change. Attention on follow-up imaging advise.   Electronically Signed  By: Kerby Moors M.D.  On: 04/26/2016 13:50  I personally reviewed the CT chest and concur with the findings noted above.  Impression: Anna Larson is a 50 year old woman who had a right upper lobe posterior segmentectomy for a stage IIA carcinoid tumor in December 2016. Her CT today shows some thickening along the suture  line. This is consistent with an opacity in that area of his been present since surgery. This will need to be followed but I would not attempt to biopsy that area currently is a think it's unlikely to represent recurrence.  She does still have some incisional discomfort but it has improved dramatically from the early postoperative period   She has an appointment with Dr. Julien Larson in August  Plan: I'll plan to see her back in 6-7 months after her next CT scan.  [name], MD [facility] [phone]

## 2016-05-09 ENCOUNTER — Ambulatory Visit: Payer: PRIVATE HEALTH INSURANCE | Admitting: Internal Medicine

## 2016-05-31 ENCOUNTER — Telehealth: Payer: Self-pay | Admitting: Internal Medicine

## 2016-05-31 NOTE — Telephone Encounter (Signed)
PAL - moved 7/31 appointments to 8/8. Left message for patient and mailed schedule.

## 2016-06-06 ENCOUNTER — Ambulatory Visit: Payer: PRIVATE HEALTH INSURANCE | Admitting: Internal Medicine

## 2016-06-10 ENCOUNTER — Telehealth: Payer: Self-pay | Admitting: Internal Medicine

## 2016-06-10 NOTE — Telephone Encounter (Signed)
pt called to r/s appt to 9/12'@3'$ :15

## 2016-06-14 ENCOUNTER — Ambulatory Visit: Payer: PRIVATE HEALTH INSURANCE | Admitting: Internal Medicine

## 2016-07-15 ENCOUNTER — Telehealth: Payer: Self-pay | Admitting: Internal Medicine

## 2016-07-15 NOTE — Telephone Encounter (Signed)
07/19/2016 Appointment rescheduled to 08/09/2016 per patient request. Patient requested her appointment to be moved to a later date.

## 2016-07-19 ENCOUNTER — Ambulatory Visit: Payer: PRIVATE HEALTH INSURANCE | Admitting: Pulmonary Disease

## 2016-07-19 ENCOUNTER — Ambulatory Visit: Payer: PRIVATE HEALTH INSURANCE | Admitting: Internal Medicine

## 2016-08-09 ENCOUNTER — Ambulatory Visit: Payer: PRIVATE HEALTH INSURANCE | Admitting: Internal Medicine

## 2016-08-24 ENCOUNTER — Ambulatory Visit (INDEPENDENT_AMBULATORY_CARE_PROVIDER_SITE_OTHER): Payer: BLUE CROSS/BLUE SHIELD | Admitting: Pulmonary Disease

## 2016-08-24 ENCOUNTER — Encounter: Payer: Self-pay | Admitting: Pulmonary Disease

## 2016-08-24 VITALS — BP 124/76 | HR 72 | Ht 67.0 in | Wt 271.0 lb

## 2016-08-24 DIAGNOSIS — K219 Gastro-esophageal reflux disease without esophagitis: Secondary | ICD-10-CM

## 2016-08-24 DIAGNOSIS — J4520 Mild intermittent asthma, uncomplicated: Secondary | ICD-10-CM

## 2016-08-24 DIAGNOSIS — Z23 Encounter for immunization: Secondary | ICD-10-CM

## 2016-08-24 DIAGNOSIS — C3490 Malignant neoplasm of unspecified part of unspecified bronchus or lung: Secondary | ICD-10-CM | POA: Diagnosis not present

## 2016-08-24 LAB — PULMONARY FUNCTION TEST
FEF 25-75 PRE: 2.29 L/s
FEF 25-75 Post: 2.45 L/sec
FEF2575-%Change-Post: 6 %
FEF2575-%PRED-PRE: 77 %
FEF2575-%Pred-Post: 82 %
FEV1-%Change-Post: 1 %
FEV1-%PRED-POST: 79 %
FEV1-%PRED-PRE: 78 %
FEV1-POST: 2.47 L
FEV1-PRE: 2.43 L
FEV1FVC-%Change-Post: 2 %
FEV1FVC-%PRED-PRE: 100 %
FEV6-%CHANGE-POST: -1 %
FEV6-%PRED-PRE: 79 %
FEV6-%Pred-Post: 78 %
FEV6-POST: 3 L
FEV6-Pre: 3.04 L
FEV6FVC-%CHANGE-POST: 0 %
FEV6FVC-%PRED-POST: 102 %
FEV6FVC-%Pred-Pre: 103 %
FVC-%Change-Post: 0 %
FVC-%Pred-Post: 76 %
FVC-%Pred-Pre: 77 %
FVC-PRE: 3.04 L
FVC-Post: 3.01 L
POST FEV6/FVC RATIO: 100 %
PRE FEV1/FVC RATIO: 80 %
Post FEV1/FVC ratio: 82 %
Pre FEV6/FVC Ratio: 100 %

## 2016-08-24 MED ORDER — FERROUS SULFATE 325 (65 FE) MG PO TABS: 325.0000 mg | ORAL_TABLET | Freq: Three times a day (TID) | ORAL | 3 refills | Status: DC

## 2016-08-24 NOTE — Progress Notes (Signed)
Subjective:    Patient ID: Anna Larson, female    DOB: 13-Mar-1966, 50 y.o.   MRN: [id number]  C.C.:  Follow-up for Mild, Intermittent Asthma, NSCLC, & GERD.  HPI Mild, Intermittent Asthma: Stopped Symbicort at last appointment. No exacerbations since last appointment. Denies any wheezing or coughing. No new dyspnea.   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 Patient s/p surgical resection/RUL segmentectomy on 10/12/15 which showed atypical carcinoid tumor 1.4 cm in greatest dimension with metastasis to level 10R lymph node. Following with Medical Oncology [facility]).  GERD:  No reflux or dyspepsia. Not currently on medication.  Review of Systems No chest pain or pressure. No fever or chills. No rashes or bruising.   Allergies  Allergen Reactions  . Codeine Rash    Current Outpatient Prescriptions on File Prior to Visit  Medication Sig Dispense Refill  . chlorpheniramine (CHLOR-TRIMETON) 4 MG tablet Take 3 tablets (12 mg total) by mouth at bedtime. 90 tablet 2  . ferrous sulfate 325 (65 FE) MG tablet Take 1 tablet (325 mg total) by mouth 3 (three) times daily with meals. 90 tablet 2  . oxyCODONE (OXY IR/ROXICODONE) 5 MG immediate release tablet Take 1-2 tablets (5-10 mg total) by mouth every 4 (four) hours as needed for severe pain. 30 tablet 0   No current facility-administered medications on file prior to visit.     Past Medical History:  Diagnosis Date  . Allergic rhinitis   . Anemia    Takes Ferrous sulfate  . Arthritis   . Asthma   . COPD (chronic obstructive pulmonary disease) ([place])   . Cough   . GERD (gastroesophageal reflux disease)   . History of kidney stones    x 3 episodes  . Hx of adenomatous + sessile serrated colonic polyps 09/22/2015  . Lung nodule < 6cm on CT   . Pneumonia    hx of  . Primary atypical carcinoid tumor of right lung [facility])    Right upper lobe segmentectomy 2016- T1N1    Past Surgical History:  Procedure Laterality Date    . CESAREAN SECTION    . COLONOSCOPY WITH PROPOFOL N/A 09/22/2015   Procedure: COLONOSCOPY WITH PROPOFOL;  Surgeon: Gatha Mayer, MD;  [facility];  Service: Endoscopy;  Laterality: N/A;  . ESOPHAGOGASTRODUODENOSCOPY (EGD) WITH PROPOFOL N/A 09/22/2015   Procedure: ESOPHAGOGASTRODUODENOSCOPY (EGD) WITH PROPOFOL;  Surgeon: Gatha Mayer, MD;  [facility];  Service: Endoscopy;  Laterality: N/A;  . LITHOTRIPSY     x2  . SEGMENTECOMY Right 10/12/2015   Procedure: RIGHT UPPER LOBE SEGMENTECTOMY;  Surgeon: [name], MD;  Location: [place];  Service: Thoracic;  Laterality: Right;  . TUBAL LIGATION    . VIDEO ASSISTED THORACOSCOPY Right 10/12/2015   Procedure: VIDEO ASSISTED THORACOSCOPY;  Surgeon: [name], MD;  Location: [place];  Service: Thoracic;  Laterality: Right;    Family History  Problem Relation Age of Onset  . Bone cancer Maternal Grandmother   . Cancer Maternal Grandmother     bone  . Multiple sclerosis Mother   . Prostate cancer Paternal Grandfather   . GER disease Father   . Emphysema Maternal Grandfather   . Cancer Maternal Grandfather     emphysema    Social History   Social History  . Marital status: Married    Spouse name: N/A  . Number of children: 2  . Years of education: N/A   Occupational History  . [employer]   Social History Main Topics  . Smoking status: Passive Smoke Exposure - Never Smoker  . Smokeless tobacco: Never Used     Comment: both mothers smoked  . Alcohol use 0.0 oz/week     Comment: occasional  . Drug use: No  . Sexual activity: Not Asked   Other Topics Concern  . None   Social History Narrative   Originally from [place]. Has always lived in [place]. Has traveled to [place] No international travel. Works in the [employer] system in [employer]. Has an outdoor dog. No bird, mold, or hot tube exposure. Enjoys cross stitching.       Objective:   Physical Exam BP 124/76  (BP Location: Left Arm, Cuff Size: Normal)   Pulse 72   Ht '5\' 7"'$  (1.702 m)   Wt 271 lb (122.9 kg)   SpO2 99%   BMI 42.44 kg/m  General:  Central obesity noted. No distress. Comfortable. Integument:  Warm & dry. No rash on exposed skin.  HEENT: Moist mucus membranes. No nasal turbinate swelling. No oral ulcers. Cardiovascular:  Regular rate and rhythm. Normal S1 & S2. Pulmonary:  Speaking in complete sentences. Clear on auscultation. Normal work of breathing on room air. Abdomen: Soft. Normal bowel sounds. Protuberant. Musculoskeletal:  Normal bulk and tone. No joint deformity or effusion appreciated.  PFT 08/24/16: FVC 3.04 L (77%) FEV1 2.43 L (78%) FEV1/FVC 0.80 FEF 25-75 2.29 L (77%) negative bronchodilator response 01/20/16: FVC 3.16 L (80%) FEV1 2.53 L (81%) FEV1/FVC 0.80 FEF 25-75 2.40 L (80%) negative bronchodilator response TLC 4.66 L (84%) RV 80% ERV 27% DLCO corrected 84% 08/26/15: FVC 3.09 L (78%) FEV1 2.07 L (66%) FEV1/FVC 0.67 FEF 25-75 1.86 L (62%) negative bronchodilator response TLC 4.26 L (77%) RV 71% ERV 15% DLCO uncorrected 69% 04/04/13: FVC 3.20 L (80%) FEV1 2.50 L (77%) FEV1/FVC 0.78 FEF 25-75 2.46 L (75%)  IMAGING CT CHEST W/ 04/26/16 (per radiologist):  No acute findings identified. No complications status post partial right upper lobectomy. Nonspecific soft tissue and thickening along the right lung suture line is identified as above. This likely reflects postoperative change.  PET CT 08/10/15 (previously reviewed by me): Right upper lobe nodule with max SUV 3.8. No pathologic mediastinal or hilar adenopathy. Left-sided axillary lymph node with max SUV 2.7. Multiple small inguinal lymph nodes. Diffuse increased metabolic activity in the bony structures most notably the spine and pelvis as well as muscular activity.  PATHOLOGY RUL RESECTION & LYMPH NODE DISSECTION (10/12/15): Atypical carcinoid tumor 1.4 cm in greatest dimension. 10R lymph node with metastasis. Level 7  & 12R lymph nodes negative.  LABS 09/08/15 ANA:  Negative Anti-CCP:  <16 SSA:  <1.0 SSB:  <1.0  08/26/15 Alpha-1 antitrypsin:  173  09/23/13 IgE:  2.1 RAST Panel:  Negative     Assessment & Plan:  50 y.o. Caucasian female with mild, intermittent asthma. Patient remains asymptomatic. Her pulmonary function testing continues to show no evidence of fixed airway obstruction or significant bronchodilator response. Her CT imaging per radiology report shows no signs of recurrence. Given patient's continued respiratory stability I will see her back in 1 year or sooner if needed.  1. Mild, Intermittent Asthma:  Continues to remain asymptomatic off medications. Patient instructed to notify me if she develops any new symptoms prior to next appointment. Checking full pulmonary function testing at follow-up appointment. 2. Stage IIA NSCLC:  Follows with medical oncology. Repeat imaging in June was negative for signs  of progression/recurrence. 3. GERD:  Remains asymptomatic off medication. 4. Health Maintenance: S/P Pneumovax 29 September 2013 & Prevnar December 2016. Administering influenza vaccine today. 5. Follow-up:  Patient to return to clinic in 1 year or sooner if needed.  Sonia Baller Ashok Cordia, M.D. [facility] Pulmonary & Critical Care Pager:  [phone] After 3pm or if no response, call [phone] 4:53 PM 08/24/16

## 2016-08-24 NOTE — Progress Notes (Signed)
Test reviewed.  

## 2016-08-24 NOTE — Patient Instructions (Signed)
   Make sure you establish a primary care physician to address your anemia and other medical needs.  I will see you back in 1 year but call me if you have trouble breathing and need to be seen sooner.  TESTS ORDERED: 1. Full PFTs at follow-up

## 2016-09-12 ENCOUNTER — Ambulatory Visit (HOSPITAL_BASED_OUTPATIENT_CLINIC_OR_DEPARTMENT_OTHER): Payer: BLUE CROSS/BLUE SHIELD | Admitting: Internal Medicine

## 2016-09-12 ENCOUNTER — Telehealth: Payer: Self-pay | Admitting: Internal Medicine

## 2016-09-12 ENCOUNTER — Encounter: Payer: Self-pay | Admitting: Internal Medicine

## 2016-09-12 VITALS — BP 111/60 | HR 82 | Temp 98.3°F | Resp 17 | Ht 67.0 in | Wt 269.8 lb

## 2016-09-12 DIAGNOSIS — R079 Chest pain, unspecified: Secondary | ICD-10-CM | POA: Diagnosis not present

## 2016-09-12 DIAGNOSIS — C7A090 Malignant carcinoid tumor of the bronchus and lung: Secondary | ICD-10-CM | POA: Diagnosis not present

## 2016-09-12 NOTE — Telephone Encounter (Signed)
Appointments scheduled per 09/12/16 los. AVS report and appointment schedule given to patient per 09/12/16 los.

## 2016-09-12 NOTE — Progress Notes (Signed)
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 Roxan Hockey.  CURRENT THERAPY: Observation.  INTERVAL HISTORY: Anna Larson 50 y.o. female returns to the clinic today for follow-up visit. The patient was supposed to come in June 2017 for evaluation and repeat imaging studies at that time. She had CT scan of the chest as well as blood work at that time but she did not come for the follow-up visit. She was busy with her husband was admitted to the hospital a few times in the last few months. She is feeling fine today with no specific complaints. She denied having any significant weight loss or night sweats. She continues to have soreness on the right side of the chest but no shortness of breath, cough or hemoptysis. She is here today for evaluation and recommendation regarding her condition.  MEDICAL HISTORY: Past Medical History:  Diagnosis Date  . Allergic rhinitis   . Anemia    Takes Ferrous sulfate  . Arthritis   . Asthma   . COPD (chronic obstructive pulmonary disease) ([place])   . Cough   . GERD (gastroesophageal reflux disease)   . History of kidney stones    x 3 episodes  . Hx of adenomatous + sessile serrated colonic polyps 09/22/2015  . Lung nodule < 6cm on CT   . Pneumonia    hx of  . Primary atypical carcinoid tumor of right lung ([place])    Right upper lobe segmentectomy 2016- T1N1    ALLERGIES:  is allergic to codeine.  MEDICATIONS:  Current Outpatient Prescriptions  Medication Sig Dispense Refill  . chlorpheniramine (CHLOR-TRIMETON) 4 MG tablet Take 3 tablets (12 mg total) by mouth at bedtime. 90 tablet 2  . ferrous sulfate 325 (65 FE) MG  tablet Take 1 tablet (325 mg total) by mouth 3 (three) times daily with meals. 90 tablet 3  . oxyCODONE (OXY IR/ROXICODONE) 5 MG immediate release tablet Take 1-2 tablets (5-10 mg total) by mouth every 4 (four) hours as needed for severe pain. (Patient not taking: Reported on 09/12/2016) 30 tablet 0   No current facility-administered medications for this visit.     SURGICAL HISTORY:  Past Surgical History:  Procedure Laterality Date  . CESAREAN SECTION    . COLONOSCOPY WITH PROPOFOL N/A 09/22/2015   Procedure: COLONOSCOPY WITH PROPOFOL;  Surgeon: Gatha Mayer, MD;  Location: [facility];  Service: Endoscopy;  Laterality: N/A;  . ESOPHAGOGASTRODUODENOSCOPY (EGD) WITH PROPOFOL N/A 09/22/2015   Procedure: ESOPHAGOGASTRODUODENOSCOPY (EGD) WITH PROPOFOL;  Surgeon: Gatha Mayer, MD;  [facility];  Service: Endoscopy;  Laterality: N/A;  . LITHOTRIPSY     x2  . SEGMENTECOMY Right 10/12/2015   Procedure: RIGHT UPPER LOBE SEGMENTECTOMY;  Surgeon: [name], MD;  Location: [place];  Service: Thoracic;  Laterality: Right;  . TUBAL LIGATION    . VIDEO ASSISTED THORACOSCOPY Right 10/12/2015   Procedure: VIDEO ASSISTED THORACOSCOPY;  Surgeon: [name], MD;  Location: [facility];  Service: Thoracic;  Laterality: Right;    REVIEW OF SYSTEMS:  A comprehensive review of systems was negative.   PHYSICAL EXAMINATION: General appearance: alert, cooperative and no distress Head: Normocephalic, without obvious  abnormality, atraumatic Neck: no adenopathy, no JVD, supple, symmetrical, trachea midline and thyroid not enlarged, symmetric, no tenderness/mass/nodules Lymph nodes: Cervical, supraclavicular, and axillary nodes normal. Resp: clear to auscultation bilaterally Back: symmetric, no curvature. ROM normal. No CVA tenderness. Cardio: regular rate and rhythm, S1, S2 normal, no murmur, click, rub or gallop GI: soft, non-tender; bowel sounds normal; no masses,  no  organomegaly Extremities: extremities normal, atraumatic, no cyanosis or edema  ECOG PERFORMANCE STATUS: 1 - Symptomatic but completely ambulatory  Blood pressure 111/60, pulse 82, temperature 98.3 F (36.8 C), temperature source Oral, resp. rate 17, height '5\' 7"'$  (1.702 m), weight 269 lb 12.8 oz (122.4 kg), SpO2 100 %.  LABORATORY DATA: Lab Results  Component Value Date   WBC 4.6 04/26/2016   HGB 13.5 04/26/2016   HCT 42.8 04/26/2016   MCV 79.6 04/26/2016   PLT 186 04/26/2016      Chemistry      Component Value Date/Time   NA 140 04/26/2016 1114   K 3.9 04/26/2016 1114   CL 110 10/14/2015 0425   CO2 25 04/26/2016 1114   BUN 5.7 (L) 04/26/2016 1114   CREATININE 0.7 04/26/2016 1114      Component Value Date/Time   CALCIUM 8.6 04/26/2016 1114   ALKPHOS 104 04/26/2016 1114   AST 12 04/26/2016 1114   ALT 16 04/26/2016 1114   BILITOT 0.39 04/26/2016 1114       RADIOGRAPHIC STUDIES: No results found.  ASSESSMENT AND PLAN: This is a very pleasant 50 years old white female with a stage IIA atypical carcinoid status post right upper lobe superior segmentectomy. The patient is doing fine with no specific complaints except for the intermittent right-sided chest pain from the surgical scar. [name] CT scan of the chest in June 2017 showed no evidence for disease recurrence. I recommended for the patient to continue on observation with repeat CT scan of the chest in 2 months for restaging of her disease. She will come back for follow-up visit at that time. The patient voices understanding of current disease status and treatment options and is in agreement with the current care plan.  All questions were answered. The patient knows to call the clinic with any problems, questions or concerns. We can certainly see the patient much sooner if necessary.  Disclaimer: This note was dictated with voice recognition software. Similar sounding words can inadvertently be transcribed and  may not be corrected upon review.

## 2016-10-17 ENCOUNTER — Encounter: Payer: Self-pay | Admitting: Pulmonary Disease

## 2016-10-17 ENCOUNTER — Ambulatory Visit (INDEPENDENT_AMBULATORY_CARE_PROVIDER_SITE_OTHER): Payer: BLUE CROSS/BLUE SHIELD | Admitting: Pulmonary Disease

## 2016-10-17 VITALS — BP 128/74 | HR 75 | Temp 97.7°F | Ht 67.0 in | Wt 272.0 lb

## 2016-10-17 DIAGNOSIS — J4520 Mild intermittent asthma, uncomplicated: Secondary | ICD-10-CM | POA: Diagnosis not present

## 2016-10-17 DIAGNOSIS — C3490 Malignant neoplasm of unspecified part of unspecified bronchus or lung: Secondary | ICD-10-CM | POA: Diagnosis not present

## 2016-10-17 DIAGNOSIS — K219 Gastro-esophageal reflux disease without esophagitis: Secondary | ICD-10-CM | POA: Diagnosis not present

## 2016-10-17 DIAGNOSIS — J019 Acute sinusitis, unspecified: Secondary | ICD-10-CM | POA: Diagnosis not present

## 2016-10-17 MED ORDER — LEVOFLOXACIN 500 MG PO TABS: 500.0000 mg | ORAL_TABLET | Freq: Every day | ORAL | 0 refills | Status: DC

## 2016-10-17 NOTE — Patient Instructions (Signed)
   Call me if your facial pain gets any worse or you develop any new fever, chills, or sweats.  Call me if you have any problems with your antibiotic.  I will see you back as planned in 1 year or sooner if needed.

## 2016-10-17 NOTE — Progress Notes (Signed)
Subjective:    Patient ID: Anna Larson, female    DOB: 1965/12/24, 50 y.o.   MRN: [id number]  C.C.:  Acute visit for cough & dyspnea with known Mild, Intermittent Asthma, NSCLC, & GERD.  HPI She started with a sore throat last week that progressed to a cough that was productive of a clear mucus. No fever, chills, or sweats. She reports a fullness in her right face as well as a fullness in her right ear. 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 She does feel she coughs more when laying recumbent. She took Cefprozil starting last Tuesday.   Mild, Intermittent Asthma: Previously on Symbicort but stopped. She has been wheezing intermittently with her coughing. She does wake up at night coughing.   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 Patient s/p surgical resection/RUL segmentectomy on 10/12/15 which showed atypical carcinoid tumor 1.4 cm in greatest dimension with metastasis to level 10R lymph node. Following with Medical On[facility]).  GERD:  Not currently on medication. No reflux or dyspepsia.   Review of Systems She reports she does have some tightness in her chest today. No frank chest pain. No abdominal pain, nausea, or emesis.   Allergies  Allergen Reactions  . Codeine Rash    Current Outpatient Prescriptions on File Prior to Visit  Medication Sig Dispense Refill  . chlorpheniramine (CHLOR-TRIMETON) 4 MG tablet Take 3 tablets (12 mg total) by mouth at bedtime. 90 tablet 2  . ferrous sulfate 325 (65 FE) MG tablet Take 1 tablet (325 mg total) by mouth 3 (three) times daily with meals. 90 tablet 3  . oxyCODONE (OXY IR/ROXICODONE) 5 MG immediate release tablet Take 1-2 tablets (5-10 mg total) by mouth every 4 (four) hours as needed for severe pain. (Patient not taking: Reported on 10/17/2016) 30 tablet 0   No current facility-administered medications on file prior to visit.     Past Medical History:  Diagnosis Date  . Allergic rhinitis   .  Anemia    Takes Ferrous sulfate  . Arthritis   . Asthma   . COPD (chronic obstructive pulmonary disease) ([place])   . Cough   . GERD (gastroesophageal reflux disease)   . History of kidney stones    x 3 episodes  . Hx of adenomatous + sessile serrated colonic polyps 09/22/2015  . Lung nodule < 6cm on CT   . Pneumonia    hx of  . Primary atypical carcinoid tumor of right lung [facility])    Right upper lobe segmentectomy 2016- T1N1    Past Surgical History:  Procedure Laterality Date  . CESAREAN SECTION    . COLONOSCOPY WITH PROPOFOL N/A 09/22/2015   Procedure: COLONOSCOPY WITH PROPOFOL;  Surgeon: Gatha Mayer, MD;  [facility];  Service: Endoscopy;  Laterality: N/A;  . ESOPHAGOGASTRODUODENOSCOPY (EGD) WITH PROPOFOL N/A 09/22/2015   Procedure: ESOPHAGOGASTRODUODENOSCOPY (EGD) WITH PROPOFOL;  Surgeon: Gatha Mayer, MD;  [facility];  Service: Endoscopy;  Laterality: N/A;  . LITHOTRIPSY     x2  . SEGMENTECOMY Right 10/12/2015   Procedure: RIGHT UPPER LOBE SEGMENTECTOMY;  Surgeon: [name], MD;  Location: [place];  Service: Thoracic;  Laterality: Right;  . TUBAL LIGATION    . VIDEO ASSISTED THORACOSCOPY Right 10/12/2015   Procedure: VIDEO ASSISTED THORACOSCOPY;  Surgeon: [name], MD;  Location: [place];  Service: Thoracic;  Laterality: Right;    Family History  Problem Relation Age of Onset  . Bone  cancer Maternal Grandmother   . Cancer Maternal Grandmother     bone  . Multiple sclerosis Mother   . GER disease Father   . Emphysema Maternal Grandfather   . Cancer Maternal Grandfather     emphysema  . Prostate cancer Paternal Grandfather     Social History   Social History  . Marital status: Married    Spouse name: N/A  . Number of children: 2  . Years of education: N/A   Occupational History  . [place] History Main Topics  . Smoking status: Passive Smoke Exposure - Never Smoker  . Smokeless  tobacco: Never Used     Comment: both mothers smoked  . Alcohol use 0.0 oz/week     Comment: occasional  . Drug use: No  . Sexual activity: Not Asked   Other Topics Concern  . None   Social History Narrative   Originally from [place]. Has always lived in [place]. Has traveled to [facility]. No international travel. Works in the [employer] system in [employer]. Has an outdoor dog. No bird, mold, or hot tube exposure. Enjoys cross stitching.       Objective:   Physical Exam BP 128/74 (BP Location: Left Arm, Cuff Size: Normal)   Pulse 75   Temp 97.7 F (36.5 C) (Oral)   Ht '5\' 7"'$  (1.702 m)   Wt 272 lb (123.4 kg)   SpO2 100%   BMI 42.60 kg/m  General:  Central obesity noted. No distress. Alert. Integument:  Warm & dry. No rash on exposed skin.  HEENT: Moist mucus membranes. No nasal turbinate swelling. Mild sinus tenderness to palpation of right frontal & maxillary sinuses. Normal right tympanic membrane.  Cardiovascular:  Regular rate and rhythm. Normal S1 & S2. Trace edema. Pulmonary:  Referred coarse upper airway wheeze. Normal work of breathing on room air. Speaking in complete sentences. Abdomen: Soft. Normal bowel sounds. Protuberant. Musculoskeletal:  Normal bulk and tone. No joint deformity or effusion appreciated.  PFT 08/24/16: FVC 3.04 L (77%) FEV1 2.43 L (78%) FEV1/FVC 0.80 FEF 25-75 2.29 L (77%) negative bronchodilator response 01/20/16: FVC 3.16 L (80%) FEV1 2.53 L (81%) FEV1/FVC 0.80 FEF 25-75 2.40 L (80%) negative bronchodilator response TLC 4.66 L (84%) RV 80% ERV 27% DLCO corrected 84% 08/26/15: FVC 3.09 L (78%) FEV1 2.07 L (66%) FEV1/FVC 0.67 FEF 25-75 1.86 L (62%) negative bronchodilator response TLC 4.26 L (77%) RV 71% ERV 15% DLCO uncorrected 69% 04/04/13: FVC 3.20 L (80%) FEV1 2.50 L (77%) FEV1/FVC 0.78 FEF 25-75 2.46 L (75%)  IMAGING CT CHEST W/ 04/26/16 (per radiologist):  No acute findings identified. No complications status post partial right upper  lobectomy. Nonspecific soft tissue and thickening along the right lung suture line is identified as above. This likely reflects postoperative change.  PET CT 08/10/15 (previously reviewed by me): Right upper lobe nodule with max SUV 3.8. No pathologic mediastinal or hilar adenopathy. Left-sided axillary lymph node with max SUV 2.7. Multiple small inguinal lymph nodes. Diffuse increased metabolic activity in the bony structures most notably the spine and pelvis as well as muscular activity.  PATHOLOGY RUL RESECTION & LYMPH NODE DISSECTION (10/12/15): Atypical carcinoid tumor 1.4 cm in greatest dimension. 10R lymph node with metastasis. Level 7 & 12R lymph nodes negative.  LABS 09/08/15 ANA:  Negative Anti-CCP:  <16 SSA:  <1.0 SSB:  <1.0  08/26/15 Alpha-1 antitrypsin:  173  09/23/13 IgE:  2.1 RAST Panel:  Negative  Assessment & Plan:  50 y.o. Caucasian female with mild, intermittent asthma, NSCLC, & GERD. Patient presenting with acute right maxillary and frontal sinusitis. Partially treated with cephalosporin. Given continued symptoms and possible recent worsening I am going to provide additional atypical organism coverage with a fluoroquinolone. Patient has no signs of asthma exacerbation at this time therefore holding off on initiating medications. Instructed the patient to notify my office if she does not get any better or has any new breathing problems or infectious symptoms before her next appointment.  1. Acute, Nonrecurrent Sinusitis:  Treating with Levaquin '500mg'$  po daily x7 days.  2. Mild, Intermittent Asthma:  Holding on inhaler & steroid treatment at this time. No signs of exacerbation.  3. Stage IIA NSCLC:  Follows with medical oncology.  4. GERD:  Remains asymptomatic off medication. 5. Health Maintenance: S/P Influenza October 2017, Pneumovax 29 September 2013 & Prevnar December 2016. 6. Follow-up:  Patient to return to clinic in 1 year or sooner if needed.   Sonia Baller  Ashok Cordia, M.D. [facility] Pager:  [phone] After 3pm or if no response, call [phone] 4:24 PM 10/17/16

## 2016-10-21 ENCOUNTER — Telehealth: Payer: Self-pay | Admitting: Pulmonary Disease

## 2016-10-21 NOTE — Telephone Encounter (Signed)
Spoke with pt,aware of recs.  Nothing further needed.  

## 2016-10-21 NOTE — Telephone Encounter (Signed)
She should finish out her course of antibiotics as prescribed. Let me know if she is totally better at the end. Thanks.

## 2016-10-21 NOTE — Telephone Encounter (Signed)
Called and spoke with pt and she stated that she was advised to call back and let JN know how she was doing.  She stated that her chest feels a little better but is still tight.  She stated that she is still coughing but with clear sputum. Also c/o headache on the right side and around her eyes.  JN please advise. Thanks  Allergies  Allergen Reactions  . Codeine Rash

## 2016-10-24 ENCOUNTER — Telehealth: Payer: Self-pay | Admitting: Pulmonary Disease

## 2016-10-24 DIAGNOSIS — J45991 Cough variant asthma: Secondary | ICD-10-CM

## 2016-10-24 MED ORDER — FLUCONAZOLE 150 MG PO TABS: 150.0000 mg | ORAL_TABLET | Freq: Every day | ORAL | 0 refills | Status: DC

## 2016-10-24 NOTE — Telephone Encounter (Signed)
Called and spoke with pt and she is aware of JN recs and she will come in tomorrow for the cxr---and she is aware of diflucan that has been sent to the pharmacy.

## 2016-10-24 NOTE — Telephone Encounter (Signed)
I'm forwarding to [name]. Will address is he doesn't get to her.

## 2016-10-24 NOTE — Telephone Encounter (Signed)
Let's check a CXR PA/LAT for her pain today if possible but certainly by tomorrow. Please send in a dose of diflucan '150mg'$  po x1. Thanks.

## 2016-10-24 NOTE — Telephone Encounter (Signed)
Pt finished levaquin given on 10/17/16, still having R sided chest pain, shortness of breath, prod cough with now clear mucus.  This was discolored originally.  Pt requesting further recs.  Pt also requesting something to help with yeast infection as she has developed one being on Levaquin.    Pt uses [facility] [address] in [place].    Sending to DOD as Durene Cal is unavailable this afternoon. RB please advise on further recs.  Thanks.

## 2016-10-25 ENCOUNTER — Ambulatory Visit (INDEPENDENT_AMBULATORY_CARE_PROVIDER_SITE_OTHER)
Admission: RE | Admit: 2016-10-25 | Discharge: 2016-10-25 | Disposition: A | Discharge status: Home / Self Care | Payer: BLUE CROSS/BLUE SHIELD | Source: Ambulatory Visit | Attending: Pulmonary Disease | Admitting: Pulmonary Disease

## 2016-10-25 DIAGNOSIS — J45991 Cough variant asthma: Secondary | ICD-10-CM

## 2016-10-26 ENCOUNTER — Telehealth: Payer: Self-pay | Admitting: Pulmonary Disease

## 2016-10-26 NOTE — Telephone Encounter (Signed)
Spoke with patient about results. She verbalized understanding. Denied Tessalon pearls due to having them already. No further questions.

## 2016-11-01 ENCOUNTER — Ambulatory Visit: Payer: BLUE CROSS/BLUE SHIELD | Admitting: Thoracic Surgery (Cardiothoracic Vascular Surgery)

## 2016-11-03 ENCOUNTER — Telehealth: Payer: Self-pay | Admitting: Internal Medicine

## 2016-11-03 NOTE — Telephone Encounter (Signed)
Faxed records to [facility] health

## 2016-11-10 ENCOUNTER — Telehealth: Payer: Self-pay | Admitting: Internal Medicine

## 2016-11-10 NOTE — Telephone Encounter (Signed)
left message on patient's voicemail to inform her that appt scheduled for 11/15/15 at 2:00pm was rescheduled for 11/17/15 at 3:45pm

## 2016-11-11 ENCOUNTER — Other Ambulatory Visit (HOSPITAL_BASED_OUTPATIENT_CLINIC_OR_DEPARTMENT_OTHER): Payer: BLUE CROSS/BLUE SHIELD

## 2016-11-11 ENCOUNTER — Ambulatory Visit (HOSPITAL_COMMUNITY)
Admission: RE | Admit: 2016-11-11 | Discharge: 2016-11-11 | Disposition: A | Discharge status: Home / Self Care | Payer: BLUE CROSS/BLUE SHIELD | Source: Ambulatory Visit | Attending: Internal Medicine | Admitting: Internal Medicine

## 2016-11-11 ENCOUNTER — Telehealth: Payer: Self-pay | Admitting: *Deleted

## 2016-11-11 DIAGNOSIS — C7A090 Malignant carcinoid tumor of the bronchus and lung: Secondary | ICD-10-CM | POA: Insufficient documentation

## 2016-11-11 LAB — CBC WITH DIFFERENTIAL/PLATELET
BASO%: 0.1 % (ref 0.0–2.0)
BASOS ABS: 0 10*3/uL (ref 0.0–0.1)
EOS%: 0.9 % (ref 0.0–7.0)
Eosinophils Absolute: 0.1 10*3/uL (ref 0.0–0.5)
HEMATOCRIT: 41.9 % (ref 34.8–46.6)
HGB: 13.2 g/dL (ref 11.6–15.9)
LYMPH#: 2.1 10*3/uL (ref 0.9–3.3)
LYMPH%: 30.4 % (ref 14.0–49.7)
MCH: 25.4 pg (ref 25.1–34.0)
MCHC: 31.5 g/dL (ref 31.5–36.0)
MCV: 80.7 fL (ref 79.5–101.0)
MONO#: 0.5 10*3/uL (ref 0.1–0.9)
MONO%: 7.5 % (ref 0.0–14.0)
NEUT#: 4.3 10*3/uL (ref 1.5–6.5)
NEUT%: 61.1 % (ref 38.4–76.8)
Platelets: 171 10*3/uL (ref 145–400)
RBC: 5.19 10*6/uL (ref 3.70–5.45)
RDW: 15.3 % — ABNORMAL HIGH (ref 11.2–14.5)
WBC: 7.1 10*3/uL (ref 3.9–10.3)

## 2016-11-11 LAB — COMPREHENSIVE METABOLIC PANEL
ALT: 15 U/L (ref 0–55)
ANION GAP: 8 meq/L (ref 3–11)
AST: 13 U/L (ref 5–34)
Albumin: 4 g/dL (ref 3.5–5.0)
Alkaline Phosphatase: 119 U/L (ref 40–150)
BUN: 8.3 mg/dL (ref 7.0–26.0)
CALCIUM: 9.3 mg/dL (ref 8.4–10.4)
CHLORIDE: 107 meq/L (ref 98–109)
CO2: 26 mEq/L (ref 22–29)
Creatinine: 0.7 mg/dL (ref 0.6–1.1)
Glucose: 88 mg/dl (ref 70–140)
POTASSIUM: 4.2 meq/L (ref 3.5–5.1)
Sodium: 141 mEq/L (ref 136–145)
Total Bilirubin: 0.29 mg/dL (ref 0.20–1.20)
Total Protein: 6.9 g/dL (ref 6.4–8.3)

## 2016-11-11 MED ORDER — IOPAMIDOL (ISOVUE-300) INJECTION 61%
INTRAVENOUS | Status: AC
  Filled 2016-11-11: qty 75

## 2016-11-11 MED ORDER — IOPAMIDOL (ISOVUE-300) INJECTION 61%: 75.0000 mL | Freq: Once | INTRAVENOUS | Status: DC | PRN

## 2016-11-11 NOTE — Telephone Encounter (Signed)
"  I need to know if I may eat before today's scans."  Advised she may eat but only liquids after 11:15 am which is four hours before arrival time for today's CT Chest scan.  No further questions.

## 2016-11-14 ENCOUNTER — Ambulatory Visit: Payer: BLUE CROSS/BLUE SHIELD | Admitting: Internal Medicine

## 2016-11-16 ENCOUNTER — Telehealth: Payer: Self-pay | Admitting: Internal Medicine

## 2016-11-16 ENCOUNTER — Ambulatory Visit (HOSPITAL_BASED_OUTPATIENT_CLINIC_OR_DEPARTMENT_OTHER): Payer: BLUE CROSS/BLUE SHIELD | Admitting: Internal Medicine

## 2016-11-16 ENCOUNTER — Encounter: Payer: Self-pay | Admitting: Internal Medicine

## 2016-11-16 VITALS — BP 126/69 | HR 87 | Temp 98.4°F | Resp 18 | Ht 67.0 in | Wt 271.6 lb

## 2016-11-16 DIAGNOSIS — C7A090 Malignant carcinoid tumor of the bronchus and lung: Secondary | ICD-10-CM

## 2016-11-16 NOTE — Telephone Encounter (Signed)
Gave patient avs report and appointments for July. Central radiology will call re scan.  °

## 2016-11-16 NOTE — Progress Notes (Signed)
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 Roxan Hockey.  CURRENT THERAPY: Observation.  INTERVAL HISTORY: Anna Larson 51 y.o. female came to the clinic today for follow-up visit. The patient is feeling fine with no specific complaints. She was recently treated for upper respiratory infection with Levaquin and feeling much better. She denied having any current chest pain, shortness of breath but has mild cough with no hemoptysis. She denied having any significant weight loss or night sweats. She denied having any fever or chills. She has no nausea, vomiting, diarrhea or constipation. The patient had repeat CT scan of the chest performed recently and she is here for evaluation and discussion of her scan results.  MEDICAL HISTORY: Past Medical History:  Diagnosis Date  . Allergic rhinitis   . Anemia    Takes Ferrous sulfate  . Arthritis   . Asthma   . COPD (chronic obstructive pulmonary disease) ([place])   . Cough   . GERD (gastroesophageal reflux disease)   . History of kidney stones    x 3 episodes  . Hx of adenomatous + sessile serrated colonic polyps 09/22/2015  . Lung nodule < 6cm on CT   . Pneumonia    hx of  . Primary atypical carcinoid tumor of right lung ([place])    Right upper lobe segmentectomy 2016- T1N1    ALLERGIES:  is allergic to codeine.  MEDICATIONS:  Current Outpatient Prescriptions  Medication Sig Dispense Refill  . chlorpheniramine (CHLOR-TRIMETON) 4 MG tablet Take 3 tablets (12 mg total) by mouth at bedtime. 90 tablet 2  . etodolac (LODINE) 400 MG tablet Take 400 mg by mouth 2 (two) times daily.    . ferrous sulfate 325 (65  FE) MG tablet Take 1 tablet (325 mg total) by mouth 3 (three) times daily with meals. 90 tablet 3  . oxyCODONE (OXY IR/ROXICODONE) 5 MG immediate release tablet Take 1-2 tablets (5-10 mg total) by mouth every 4 (four) hours as needed for severe pain. (Patient not taking: Reported on 11/16/2016) 30 tablet 0   No current facility-administered medications for this visit.     SURGICAL HISTORY:  Past Surgical History:  Procedure Laterality Date  . CESAREAN SECTION    . COLONOSCOPY WITH PROPOFOL N/A 09/22/2015   Procedure: COLONOSCOPY WITH PROPOFOL;  Surgeon: Gatha Mayer, MD;  Location: [facility];  Service: Endoscopy;  Laterality: N/A;  . ESOPHAGOGASTRODUODENOSCOPY (EGD) WITH PROPOFOL N/A 09/22/2015   Procedure: ESOPHAGOGASTRODUODENOSCOPY (EGD) WITH PROPOFOL;  Surgeon: Gatha Mayer, MD;  [facility];  Service: Endoscopy;  Laterality: N/A;  . LITHOTRIPSY     x2  . SEGMENTECOMY Right 10/12/2015   Procedure: RIGHT UPPER LOBE SEGMENTECTOMY;  Surgeon: [name], MD;  Location: [place];  Service: Thoracic;  Laterality: Right;  . TUBAL LIGATION    . VIDEO ASSISTED THORACOSCOPY Right 10/12/2015   Procedure: VIDEO ASSISTED THORACOSCOPY;  Surgeon: [name], MD;  Location: [place];  Service: Thoracic;  Laterality: Right;    REVIEW OF SYSTEMS:  A comprehensive review of systems was negative except for: Respiratory: positive for cough   PHYSICAL EXAMINATION: General appearance: alert, cooperative and no distress Head: Normocephalic, without  obvious abnormality, atraumatic Neck: no adenopathy, no JVD, supple, symmetrical, trachea midline and thyroid not enlarged, symmetric, no tenderness/mass/nodules Lymph nodes: Cervical, supraclavicular, and axillary nodes normal. Resp: clear to auscultation bilaterally Back: symmetric, no curvature. ROM normal. No CVA tenderness. Cardio: regular rate and rhythm, S1, S2 normal, no murmur, click, rub or gallop GI: soft, non-tender;  bowel sounds normal; no masses,  no organomegaly Extremities: extremities normal, atraumatic, no cyanosis or edema  ECOG PERFORMANCE STATUS: 1 - Symptomatic but completely ambulatory  Blood pressure 126/69, pulse 87, temperature 98.4 F (36.9 C), temperature source Oral, resp. rate 18, height '5\' 7"'$  (1.702 m), weight 271 lb 9.6 oz (123.2 kg), last menstrual period 10/13/2016, SpO2 100 %.  LABORATORY DATA: Lab Results  Component Value Date   WBC 7.1 11/11/2016   HGB 13.2 11/11/2016   HCT 41.9 11/11/2016   MCV 80.7 11/11/2016   PLT 171 11/11/2016      Chemistry      Component Value Date/Time   NA 141 11/11/2016 1451   K 4.2 11/11/2016 1451   CL 110 10/14/2015 0425   CO2 26 11/11/2016 1451   BUN 8.3 11/11/2016 1451   CREATININE 0.7 11/11/2016 1451      Component Value Date/Time   CALCIUM 9.3 11/11/2016 1451   ALKPHOS 119 11/11/2016 1451   AST 13 11/11/2016 1451   ALT 15 11/11/2016 1451   BILITOT 0.29 11/11/2016 1451       RADIOGRAPHIC STUDIES: Dg Chest 2 View  Result Date: 10/25/2016 CLINICAL DATA:  Cough with chest tightness and shortness of breath. EXAM: CHEST  2 VIEW COMPARISON:  04/26/2016 and 11/03/2015 FINDINGS: Again noted are postsurgical changes in the right lung. No focal airspace disease or pulmonary edema. Heart and mediastinum are within normal limits. No pleural effusions. No acute bone abnormality. IMPRESSION: No active cardiopulmonary disease. Electronically Signed   By: Markus Daft M.D.   On: 10/25/2016 22:24   Ct Chest W Contrast  Result Date: 11/11/2016 CLINICAL DATA:  Restaging right lung cancer. EXAM: CT CHEST WITH CONTRAST TECHNIQUE: Multidetector CT imaging of the chest was performed during intravenous contrast administration. CONTRAST:  75 cc of Isovue-300 COMPARISON:  04/26/2016 FINDINGS: Cardiovascular: The heart size is normal. There is no pericardial effusion identified. Mediastinum/Nodes: The trachea appears patent and is midline. Normal appearance  of the esophagus. No mediastinal or hilar adenopathy identified. Lungs/Pleura: No pleural fluid. Postsurgical changes from partial right upper lobectomy identified. There is no evidence to suggest residual or recurrent tumor. No new pulmonary nodules or masses identified. Upper Abdomen: Normal appearance of the adrenal glands. No acute abnormality identified. Musculoskeletal: There is a mild scoliosis deformity involving the thoracic spine which appears convex towards the left. No aggressive lytic or sclerotic bone lesion identified. IMPRESSION: 1. No acute findings identified. No evidence to suggest residual or recurrent tumor status post partial right upper lobectomy. Electronically Signed   By: Kerby Moors M.D.   On: 11/11/2016 17:08    ASSESSMENT AND PLAN:  This is a very pleasant 51 years old white female with a stage IIa atypical carcinoid status post right upper lobe superior segmentectomy. She is currently on observation and feeling very well. She had a recent CT scan of the chest that showed no evidence for disease recurrence. I discussed the scan results with the patient today. I recommended for her to continue on observation with repeat CT scan of the chest in 6 months. She was advised to immediately if she has any concerning symptoms  in the interval. The patient voices understanding of current disease status and treatment options and is in agreement with the current care plan.  All questions were answered. The patient knows to call the clinic with any problems, questions or concerns. We can certainly see the patient much sooner if necessary. I spent 10 minutes counseling the patient face to face. The total time spent in the appointment was 15 minutes. Disclaimer: This note was dictated with voice recognition software. Similar sounding words can inadvertently be transcribed and may not be corrected upon review.

## 2016-11-22 ENCOUNTER — Ambulatory Visit: Payer: BLUE CROSS/BLUE SHIELD | Admitting: Thoracic Surgery (Cardiothoracic Vascular Surgery)

## 2016-11-22 ENCOUNTER — Encounter: Payer: Self-pay | Admitting: Thoracic Surgery (Cardiothoracic Vascular Surgery)

## 2016-11-22 ENCOUNTER — Ambulatory Visit (INDEPENDENT_AMBULATORY_CARE_PROVIDER_SITE_OTHER): Payer: BLUE CROSS/BLUE SHIELD | Admitting: Thoracic Surgery (Cardiothoracic Vascular Surgery)

## 2016-11-22 VITALS — BP 128/82 | HR 70 | Resp 20 | Ht 67.0 in | Wt 271.0 lb

## 2016-11-22 DIAGNOSIS — C3491 Malignant neoplasm of unspecified part of right bronchus or lung: Secondary | ICD-10-CM

## 2016-11-22 NOTE — Progress Notes (Signed)
[address]       [place] [address]             [phone]    HPI: Ms. Breau returns for one year follow-up visit  Ms. Rookstool is a 51 year old woman who was found to have a lung nodule back in 2014. It was followed initially but increased in size over time. I did a right upper lobe posterior segmentectomy in December 2016. The terminal to be a carcinoid tumor but she did have a node involved. She was T1, N1 stage IIA. Dr. Julien Nordmann recommended adjuvant chemotherapy but she refused.  I last saw in the office in June. She was doing well at that time. She still had some pain and numbness related to the incision. Her CT showed no evidence of recurrence.  In the interim since her last visit her pain has continued to improve. She says she still does occasionally feel some pain in that area typically runs very cold. More recently she's been having some pain in her right anterior chest wall which developed back in December when she was coughing frequently.  Past Medical History:  Diagnosis Date  . Allergic rhinitis   . Anemia    Takes Ferrous sulfate  . Arthritis   . Asthma   . COPD (chronic obstructive pulmonary disease) ([place])   . Cough   . GERD (gastroesophageal reflux disease)   . History of kidney stones    x 3 episodes  . Hx of adenomatous + sessile serrated colonic polyps 09/22/2015  . Lung nodule < 6cm on CT   . Pneumonia    hx of  . Primary atypical carcinoid tumor of right lung [facility])    Right upper lobe segmentectomy 2016- T1N1     Current Outpatient Prescriptions  Medication Sig Dispense Refill  . chlorpheniramine (CHLOR-TRIMETON) 4 MG tablet Take 3 tablets (12 mg total) by mouth at bedtime. 90 tablet 2  . etodolac (LODINE) 400 MG tablet Take 400 mg by mouth daily.     . ferrous sulfate 325 (65 FE) MG tablet Take 1 tablet (325 mg total) by mouth 3 (three) times daily with meals. 90 tablet 3  . oxyCODONE (OXY IR/ROXICODONE) 5 MG immediate release  tablet Take 1-2 tablets (5-10 mg total) by mouth every 4 (four) hours as needed for severe pain. 30 tablet 0   No current facility-administered medications for this visit.     Physical Exam BP 128/82   Pulse 70   Resp 20   Ht '5\' 7"'$  (1.702 m)   Wt 271 lb (122.9 kg)   LMP 10/13/2016   SpO2 99% Comment: RA  BMI 42.44 kg/m  Obese 51 year old woman in no acute distress No cervical or supraclavicular adenopathy Lungs clear with equal breath sounds bilaterally Incisions well healed  Diagnostic Tests: CT CHEST WITH CONTRAST  TECHNIQUE: Multidetector CT imaging of the chest was performed during intravenous contrast administration.  CONTRAST:  75 cc of Isovue-300  COMPARISON:  04/26/2016  FINDINGS: Cardiovascular: The heart size is normal. There is no pericardial effusion identified.  Mediastinum/Nodes: The trachea appears patent and is midline. Normal appearance of the esophagus. No mediastinal or hilar adenopathy identified.  Lungs/Pleura: No pleural fluid. Postsurgical changes from partial right upper lobectomy identified. There is no evidence to suggest residual or recurrent tumor. No new pulmonary nodules or masses identified.  Upper Abdomen: Normal appearance of the adrenal glands. No acute abnormality identified.  Musculoskeletal: There is a mild  scoliosis deformity involving the thoracic spine which appears convex towards the left. No aggressive lytic or sclerotic bone lesion identified.  IMPRESSION: 1. No acute findings identified. No evidence to suggest residual or recurrent tumor status post partial right upper lobectomy.   Electronically Signed   By: Kerby Moors M.D.   On: 11/11/2016 17:08 I personally reviewed the CT chest and concur with the findings noted above  Impression: 51 year old woman who is now a little over a year out from a right upper lobe posterior segmentectomy and node dissection for stage IIA carcinoid tumor. She refused  adjuvant chemotherapy. She is doing well at this time with no evidence of recurrent disease.  Her incisional pain is essentially resolved. She does still have occasional pains in that area particularly associated with cold weather. I told her that she may always have that to some degree. Her anterior chest wall pain sounds like costochondritis. That usually responds to anti-inflammatories. She is already on etodolac for her ankle. She can use Tylenol in addition to that if needed.  Dr. Julien Nordmann placed see her back in 6 months and we'll do another CT at that time. To avoid duplication of services, she does not need to return to see me, as long as he is following her.  Plan: Follow-up with Dr. Julien Nordmann is scheduled.  I will be happy to see her back any time in the future if I can be of any further assistance with her care.  [name], MD [facility] [phone]

## 2016-12-14 ENCOUNTER — Ambulatory Visit (INDEPENDENT_AMBULATORY_CARE_PROVIDER_SITE_OTHER): Payer: BLUE CROSS/BLUE SHIELD | Admitting: Pulmonary Disease

## 2016-12-14 ENCOUNTER — Encounter: Payer: Self-pay | Admitting: Pulmonary Disease

## 2016-12-14 ENCOUNTER — Ambulatory Visit (INDEPENDENT_AMBULATORY_CARE_PROVIDER_SITE_OTHER)
Admission: RE | Admit: 2016-12-14 | Discharge: 2016-12-14 | Disposition: A | Discharge status: Home / Self Care | Payer: BLUE CROSS/BLUE SHIELD | Source: Ambulatory Visit | Attending: Pulmonary Disease | Admitting: Pulmonary Disease

## 2016-12-14 VITALS — BP 108/80 | HR 90 | Ht 67.0 in | Wt 267.4 lb

## 2016-12-14 DIAGNOSIS — J209 Acute bronchitis, unspecified: Secondary | ICD-10-CM

## 2016-12-14 DIAGNOSIS — J4520 Mild intermittent asthma, uncomplicated: Secondary | ICD-10-CM | POA: Diagnosis not present

## 2016-12-14 MED ORDER — PREDNISONE 20 MG PO TABS: 40.0000 mg | ORAL_TABLET | Freq: Every day | ORAL | 0 refills | Status: DC

## 2016-12-14 NOTE — Progress Notes (Signed)
Subjective:    Patient ID: Anna Larson, female    DOB: 1966-03-21, 51 y.o.   MRN: [id number]  C.C.:  Acute visit for Acute Bronchitis with known Mild, Intermittent Asthma, NSCLC, & GERD.  HPI  Acute Bronchitis: Patient was seen in urgent care on Sunday and given IM injections as well as a prescription for Bactrim and Hydromet cough syrup. Patient reports she developed a nonproductive cough on Saturday as well as decreased energy & fatigue. She reports sick contacts through her husband and son. Her son was diagnosed with influenza. She reports her flu swab was negative.  Cough is nonproductive.   Mild, Intermittent Asthma: Previously taking Symbicort. This was discontinued prior to last appointment with limited symptomatic benefit. She has been wheezing despite her recent treatment. She has had increased dyspnea with her illness.   GERD:  Currently on no medication. No reflux or dyspepsia.   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 Patient s/p surgical resection/RUL segmentectomy on 10/12/15 which showed atypical carcinoid tumor 1.4 cm in greatest dimension with metastasis to level 10R lymph node. Following with Medical Oncology [facility]).  Review of Systems She reports a mild headache. Denies any fever, chills, or sweats. Denies any myalgias. Denies any sore throat. Minimal sinus drainage.   Allergies  Allergen Reactions  . Codeine Rash    Current Outpatient Prescriptions on File Prior to Visit  Medication Sig Dispense Refill  . etodolac (LODINE) 400 MG tablet Take 400 mg by mouth daily.     . ferrous sulfate 325 (65 FE) MG tablet Take 1 tablet (325 mg total) by mouth 3 (three) times daily with meals. 90 tablet 3  . chlorpheniramine (CHLOR-TRIMETON) 4 MG tablet Take 3 tablets (12 mg total) by mouth at bedtime. (Patient not taking: Reported on 12/14/2016) 90 tablet 2  . oxyCODONE (OXY IR/ROXICODONE) 5 MG immediate release tablet Take 1-2 tablets (5-10 mg total) by mouth  every 4 (four) hours as needed for severe pain. (Patient not taking: Reported on 12/14/2016) 30 tablet 0   No current facility-administered medications on file prior to visit.     Past Medical History:  Diagnosis Date  . Allergic rhinitis   . Anemia    Takes Ferrous sulfate  . Arthritis   . Asthma   . COPD (chronic obstructive pulmonary disease) ([place])   . Cough   . GERD (gastroesophageal reflux disease)   . History of kidney stones    x 3 episodes  . Hx of adenomatous + sessile serrated colonic polyps 09/22/2015  . Lung nodule < 6cm on CT   . Pneumonia    hx of  . Primary atypical carcinoid tumor of right lung [facility])    Right upper lobe segmentectomy 2016- T1N1    Past Surgical History:  Procedure Laterality Date  . CESAREAN SECTION    . COLONOSCOPY WITH PROPOFOL N/A 09/22/2015   Procedure: COLONOSCOPY WITH PROPOFOL;  Surgeon: Gatha Mayer, MD;  [facility];  Service: Endoscopy;  Laterality: N/A;  . ESOPHAGOGASTRODUODENOSCOPY (EGD) WITH PROPOFOL N/A 09/22/2015   Procedure: ESOPHAGOGASTRODUODENOSCOPY (EGD) WITH PROPOFOL;  Surgeon: Gatha Mayer, MD;  [facility];  Service: Endoscopy;  Laterality: N/A;  . LITHOTRIPSY     x2  . SEGMENTECOMY Right 10/12/2015   Procedure: RIGHT UPPER LOBE SEGMENTECTOMY;  Surgeon: [name], MD;  Location: [place];  Service: Thoracic;  Laterality: Right;  . TUBAL LIGATION    . VIDEO ASSISTED THORACOSCOPY Right 10/12/2015   Procedure:  VIDEO ASSISTED THORACOSCOPY;  Surgeon: [name], MD;  Location: [place];  Service: Thoracic;  Laterality: Right;    Family History  Problem Relation Age of Onset  . Bone cancer Maternal Grandmother   . Cancer Maternal Grandmother     bone  . Multiple sclerosis Mother   . GER disease Father   . Emphysema Maternal Grandfather   . Cancer Maternal Grandfather     emphysema  . Prostate cancer Paternal Grandfather     Social History   Social History  . Marital status:  Married    Spouse name: N/A  . Number of children: 2  . Years of education: N/A   Occupational History  . [place] History Main Topics  . Smoking status: Passive Smoke Exposure - Never Smoker  . Smokeless tobacco: Never Used     Comment: both mothers smoked  . Alcohol use 0.0 oz/week     Comment: occasional  . Drug use: No  . Sexual activity: Not Asked   Other Topics Concern  . None   Social History Narrative   Originally from [place]. Has always lived in [place]. Has traveled to [facility]. No international travel. Works in the [employer] system in [employer]. Has an outdoor dog. No bird, mold, or hot tube exposure. Enjoys cross stitching.       Objective:   Physical Exam BP 108/80 (BP Location: Right Arm, Patient Position: Sitting, Cuff Size: Normal)   Pulse 90   Ht '5\' 7"'$  (1.702 m)   Wt 267 lb 6.4 oz (121.3 kg)   SpO2 95%   BMI 41.88 kg/m   General:  Awake. No acute distress. Mild central obesity. Integument:  Warm & dry. No rash on exposed skin.  Extremities:  No cyanosis or clubbing.  HEENT:  Moist mucus membranes. Moderate bilateral nasal turbinate swelling. No oral ulcers. Cardiovascular:  Regular rate. No edema. Normal S1 & S2. Pulmonary:  Coughing with deep inspiration. Otherwise good aeration bilaterally. No accessory muscle use on room air. Abdomen: Soft. Normal bowel sounds. Protuberant. Musculoskeletal:  Normal bulk and tone. No joint deformity or effusion appreciated.  PFT 08/24/16: FVC 3.04 L (77%) FEV1 2.43 L (78%) FEV1/FVC 0.80 FEF 25-75 2.29 L (77%) negative bronchodilator response 01/20/16: FVC 3.16 L (80%) FEV1 2.53 L (81%) FEV1/FVC 0.80 FEF 25-75 2.40 L (80%) negative bronchodilator response TLC 4.66 L (84%) RV 80% ERV 27% DLCO corrected 84% 08/26/15: FVC 3.09 L (78%) FEV1 2.07 L (66%) FEV1/FVC 0.67 FEF 25-75 1.86 L (62%) negative bronchodilator response TLC 4.26 L (77%) RV 71% ERV 15% DLCO uncorrected 69% 04/04/13: FVC  3.20 L (80%) FEV1 2.50 L (77%) FEV1/FVC 0.78 FEF 25-75 2.46 L (75%)  IMAGING CT CHEST W/ 04/26/16 (per radiologist):  No acute findings identified. No complications status post partial right upper lobectomy. Nonspecific soft tissue and thickening along the right lung suture line is identified as above. This likely reflects postoperative change.  PET CT 08/10/15 (previously reviewed by me): Right upper lobe nodule with max SUV 3.8. No pathologic mediastinal or hilar adenopathy. Left-sided axillary lymph node with max SUV 2.7. Multiple small inguinal lymph nodes. Diffuse increased metabolic activity in the bony structures most notably the spine and pelvis as well as muscular activity.  PATHOLOGY RUL RESECTION & LYMPH NODE DISSECTION (10/12/15): Atypical carcinoid tumor 1.4 cm in greatest dimension. 10R lymph node with metastasis. Level 7 & 12R lymph nodes negative.  LABS 09/08/15 ANA:  Negative  Anti-CCP:  <16 SSA:  <1.0 SSB:  <1.0  08/26/15 Alpha-1 antitrypsin:  173  09/23/13 IgE:  2.1 RAST Panel:  Negative     Assessment & Plan:  51 y.o. female with known history of mild, intermittent asthma, non-small cell lung cancer, & GERD. Patient's acute bronchitis is likely secondary to influenza given her son's illness following her's. She appears to have a mild exacerbation of her underlying asthma which likely would respond to a short course of prednisone therapy. I encouraged her to continue to finish her course of antibiotics. I am evaluating for possible underlying pneumonia with chest x-ray imaging. I instructed the patient contacted our office if she had any new breathing problems or questions before next appointment.  1. Acute Bronchitis: Likely secondary to influenza. Checking chest x-ray PA/LAT today. Finishing course of Bactrim for antibiotic coverage. 2. Mild, Intermittent Asthma:  Mild exacerbation. Starting prednisone 40 mg by mouth daily 4 days. 3. GERD: Remains asymptomatic. No new  medications at this time. 4. Stage IIA NSCLC:  Continuing to follow with medical oncology.  5. Health Maintenance: S/P Influenza October 2017, Pneumovax 29 September 2013 & Prevnar December 2016. 6. Follow-up:  Patient to return to clinic in 2 weeks with first available provider. She can follow-up with me one year from her previous appointment.  Sonia Baller Ashok Cordia, M.D. [facility] Pager:  [phone] After 3pm or if no response, call [phone] 3:49 PM 12/14/16

## 2016-12-14 NOTE — Patient Instructions (Signed)
   Finish out your antibiotics.  We will see you back in 2 weeks.  Call if you are not getting better in the next couple of days.  TESTS ORDERED: 1. CXR PA/LAT today

## 2016-12-15 ENCOUNTER — Telehealth: Payer: Self-pay | Admitting: Pulmonary Disease

## 2016-12-15 NOTE — Telephone Encounter (Signed)
Notes Recorded by Javier Glazier, MD on 12/15/2016 at 8:57 AM EST Please let the patient know I reviewed her chest x-ray. She does have a questionable area of early pneumonia within her left lower lung. She should continue on her current antibiotic and contact our office if she develops any new symptoms thank you. ------- Spoke with pt, aware of results/recs.  Nothing further needed.

## 2016-12-21 ENCOUNTER — Telehealth: Payer: Self-pay | Admitting: Pulmonary Disease

## 2016-12-21 DIAGNOSIS — J0191 Acute recurrent sinusitis, unspecified: Secondary | ICD-10-CM

## 2016-12-21 NOTE — Telephone Encounter (Signed)
Spoke with pt, aware of recs. Ct sinus ordered.  Pt already scheduled to see JN on 2/22.  Nothing further needed at this time.

## 2016-12-21 NOTE — Telephone Encounter (Signed)
[phone] pt calling back

## 2016-12-21 NOTE — Telephone Encounter (Signed)
Would do sinus CT limited asap and then f/u ov with all meds in hand to see first available

## 2016-12-21 NOTE — Telephone Encounter (Signed)
Attempted to contact pt. Someone answered the line but there was no answer. Will try back.

## 2016-12-21 NOTE — Telephone Encounter (Signed)
Spoke with pt, advised that we are awaiting provider's recs for her. Sending to DOD as Durene Cal is off today.   MW please advise.  Thanks!   AVS from 12/14/16:  Instructions     Return in about 2 weeks (around 12/28/2016).   Finish out your antibiotics.  We will see you back in 2 weeks.  Call if you are not getting better in the next couple of days.   TESTS ORDERED: 1. CXR PA/LAT today

## 2016-12-21 NOTE — Telephone Encounter (Signed)
Spoke with pt. States that she is still having issues with the same symptoms that she had at her last OV. Reports cough, neck pain and sinus pressure. Cough is non productive. Denies chest tightness, wheezing, SOB or fever. Pt would like JN's recommendations.  JN - please advise. Thanks.

## 2016-12-21 NOTE — Telephone Encounter (Signed)
Patient calling to check on msg left this am, CB is [phone].

## 2016-12-26 ENCOUNTER — Inpatient Hospital Stay: Admission: RE | Admit: 2016-12-26 | Payer: BLUE CROSS/BLUE SHIELD | Source: Ambulatory Visit

## 2016-12-27 ENCOUNTER — Ambulatory Visit (INDEPENDENT_AMBULATORY_CARE_PROVIDER_SITE_OTHER)
Admission: RE | Admit: 2016-12-27 | Discharge: 2016-12-27 | Disposition: A | Discharge status: Home / Self Care | Payer: BLUE CROSS/BLUE SHIELD | Source: Ambulatory Visit | Attending: Internal Medicine | Admitting: Internal Medicine

## 2016-12-27 DIAGNOSIS — J0191 Acute recurrent sinusitis, unspecified: Secondary | ICD-10-CM

## 2016-12-29 ENCOUNTER — Encounter: Payer: Self-pay | Admitting: Pulmonary Disease

## 2016-12-29 ENCOUNTER — Telehealth: Payer: Self-pay | Admitting: Pulmonary Disease

## 2016-12-29 ENCOUNTER — Ambulatory Visit (INDEPENDENT_AMBULATORY_CARE_PROVIDER_SITE_OTHER): Payer: BLUE CROSS/BLUE SHIELD | Admitting: Pulmonary Disease

## 2016-12-29 VITALS — BP 120/96 | HR 77 | Ht 67.0 in | Wt 272.0 lb

## 2016-12-29 DIAGNOSIS — K219 Gastro-esophageal reflux disease without esophagitis: Secondary | ICD-10-CM

## 2016-12-29 DIAGNOSIS — J4520 Mild intermittent asthma, uncomplicated: Secondary | ICD-10-CM

## 2016-12-29 DIAGNOSIS — R05 Cough: Secondary | ICD-10-CM

## 2016-12-29 DIAGNOSIS — C3491 Malignant neoplasm of unspecified part of right bronchus or lung: Secondary | ICD-10-CM | POA: Diagnosis not present

## 2016-12-29 DIAGNOSIS — R059 Cough, unspecified: Secondary | ICD-10-CM | POA: Insufficient documentation

## 2016-12-29 MED ORDER — HYDROCODONE-HOMATROPINE 5-1.5 MG PO TABS: 1.0000 | ORAL_TABLET | Freq: Four times a day (QID) | ORAL | 0 refills | Status: DC | PRN

## 2016-12-29 MED ORDER — BUDESONIDE-FORMOTEROL FUMARATE 160-4.5 MCG/ACT IN AERO: 2.0000 | INHALATION_SPRAY | Freq: Two times a day (BID) | RESPIRATORY_TRACT | 3 refills | Status: DC

## 2016-12-29 NOTE — Progress Notes (Signed)
Subjective:    Patient ID: Anna Larson, female    DOB: 1966-04-04, 51 y.o.   MRN: [id number]  C.C.:  Follow-up for recent Acute Bronchitis with known Mild, Intermittent Asthma, NSCLC, & GERD.  HPI  Acute Bronchitis: Patient previously treated with Bactrim. Influenza exposure through her son. Patient underwent chest x-ray imaging last appointment which showed questionable hazy opacity on the right.  Mild, intermittent asthma: Previously prescribed Symbicort. She reports she is continuing to have a cough productive of a clear mucus. Denies any wheezing. She reports her baseline dyspnea. Overall she feels her cough is largely unchanged.   GERD: Not currently on medication. No reflux, dyspepsia, or morning brash water taste.   Right upper lobe NSCLC (atypical carcinoid): Stage IIA status post surgical resection with right upper lobe segmentectomy on 10/12/15. 1.5 cm in greatest dimension with metastasis to 10 R lymph node. Follows with medical oncology.  Review of Systems No fever, chills, or sweats. She still is having some pain around her right eye and right neck intermittently. She is still having some sinus drainage but no pressure or congestion. Mild, intermittent chest discomfort in her right upper anterior chest with her cough.   Allergies  Allergen Reactions  . Codeine Rash    Current Outpatient Prescriptions on File Prior to Visit  Medication Sig Dispense Refill  . chlorpheniramine (CHLOR-TRIMETON) 4 MG tablet Take 3 tablets (12 mg total) by mouth at bedtime. 90 tablet 2  . etodolac (LODINE) 400 MG tablet Take 400 mg by mouth daily.     . ferrous sulfate 325 (65 FE) MG tablet Take 1 tablet (325 mg total) by mouth 3 (three) times daily with meals. 90 tablet 3  . HYDROMET 5-1.5 MG/5ML syrup Take 5 mLs by mouth as needed.  0  . oxyCODONE (OXY IR/ROXICODONE) 5 MG immediate release tablet Take 1-2 tablets (5-10 mg total) by mouth every 4 (four) hours as needed for severe pain. 30  tablet 0  . predniSONE (DELTASONE) 20 MG tablet Take 2 tablets (40 mg total) by mouth daily with breakfast. (Patient not taking: Reported on 12/29/2016) 8 tablet 0  . sulfamethoxazole-trimethoprim (BACTRIM DS,SEPTRA DS) 800-160 MG tablet Take 1 tablet by mouth 2 (two) times daily.  0   No current facility-administered medications on file prior to visit.     Past Medical History:  Diagnosis Date  . Allergic rhinitis   . Anemia    Takes Ferrous sulfate  . Arthritis   . Asthma   . COPD (chronic obstructive pulmonary disease) ([place])   . Cough   . GERD (gastroesophageal reflux disease)   . History of kidney stones    x 3 episodes  . Hx of adenomatous + sessile serrated colonic polyps 09/22/2015  . Lung nodule < 6cm on CT   . Pneumonia    hx of  . Primary atypical carcinoid tumor of right lung [facility])    Right upper lobe segmentectomy 2016- T1N1    Past Surgical History:  Procedure Laterality Date  . CESAREAN SECTION    . COLONOSCOPY WITH PROPOFOL N/A 09/22/2015   Procedure: COLONOSCOPY WITH PROPOFOL;  Surgeon: Gatha Mayer, MD;  Location: [facility];  Service: Endoscopy;  Laterality: N/A;  . ESOPHAGOGASTRODUODENOSCOPY (EGD) WITH PROPOFOL N/A 09/22/2015   Procedure: ESOPHAGOGASTRODUODENOSCOPY (EGD) WITH PROPOFOL;  Surgeon: Gatha Mayer, MD;  Location: [facility];  Service: Endoscopy;  Laterality: N/A;  . LITHOTRIPSY     x2  . SEGMENTECOMY Right 10/12/2015   Procedure:  RIGHT UPPER LOBE SEGMENTECTOMY;  Surgeon: [name], MD;  Location: [place];  Service: Thoracic;  Laterality: Right;  . TUBAL LIGATION    . VIDEO ASSISTED THORACOSCOPY Right 10/12/2015   Procedure: VIDEO ASSISTED THORACOSCOPY;  Surgeon: [name], MD;  Location: [place];  Service: Thoracic;  Laterality: Right;    Family History  Problem Relation Age of Onset  . Bone cancer Maternal Grandmother   . Cancer Maternal Grandmother     bone  . Multiple sclerosis Mother   . GER disease Father   .  Emphysema Maternal Grandfather   . Cancer Maternal Grandfather     emphysema  . Prostate cancer Paternal Grandfather     Social History   Social History  . Marital status: Married    Spouse name: N/A  . Number of children: 2  . Years of education: N/A   Occupational History  . [employer] History Main Topics  . Smoking status: Passive Smoke Exposure - Never Smoker  . Smokeless tobacco: Never Used     Comment: both mothers smoked  . Alcohol use 0.0 oz/week     Comment: occasional  . Drug use: No  . Sexual activity: Not Asked   Other Topics Concern  . None   Social History Narrative   Originally from [place]. Has always lived in [place]. Has traveled to [facility]. No international travel. Works in the [employer] system in [employer]. Has an outdoor dog. No bird, mold, or hot tube exposure. Enjoys cross stitching.       Objective:   Physical Exam BP (!) 120/96 (BP Location: Right Arm, Patient Position: Sitting, Cuff Size: Normal)   Pulse 77   Ht '5\' 7"'$  (1.702 m)   Wt 272 lb (123.4 kg)   LMP 12/04/2016 (Approximate)   SpO2 98%   BMI 42.60 kg/m   Gen.: Mildly obese female. No acute distress. Appears comfortable. Integument: Warm and dry. No rash on exposed skin. HEENT: Minimal nasal turbinate swelling. No scleral icterus or injection. Moist mucous membranes. Pulmonary: Clear with auscultation bilaterally. Good aeration bilaterally. No accessory muscle use on room air. Cardiovascular: Regular rate. No appreciable JVD. Normal S1 & S2. Abdomen: Soft. Protuberant. Nontender.  PFT 08/24/16: FVC 3.04 L (77%) FEV1 2.43 L (78%) FEV1/FVC 0.80 FEF 25-75 2.29 L (77%) negative bronchodilator response 01/20/16: FVC 3.16 L (80%) FEV1 2.53 L (81%) FEV1/FVC 0.80 FEF 25-75 2.40 L (80%) negative bronchodilator response TLC 4.66 L (84%) RV 80% ERV 27% DLCO corrected 84% 08/26/15: FVC 3.09 L (78%) FEV1 2.07 L (66%) FEV1/FVC 0.67 FEF 25-75 1.86 L (62%) negative  bronchodilator response TLC 4.26 L (77%) RV 71% ERV 15% DLCO uncorrected 69% 04/04/13: FVC 3.20 L (80%) FEV1 2.50 L (77%) FEV1/FVC 0.78 FEF 25-75 2.46 L (75%)  IMAGING MAXILLOFACIAL CT LTD W/O 12/27/16 (per radiologist):  Clear sinuses. No areas of bone resorption or sclerosis. Globes and orbits are unremarkable. No soft tissue masses.   CXR PA/LAT 12/14/16 (personally reviewed by me):   Questionable right lung hazy opacification. Radiology reported no focal opacity on their imaging monitor. No dense consolidation. No focal mass. No pleural effusion. Heart normal in size & mediastinum normal in contour.  CT CHEST W/ 04/26/16 (per radiologist):  No acute findings identified. No complications status post partial right upper lobectomy. Nonspecific soft tissue and thickening along the right lung suture line is identified as above. This likely reflects postoperative change.  PET CT 08/10/15 (  previously reviewed by me): Right upper lobe nodule with max SUV 3.8. No pathologic mediastinal or hilar adenopathy. Left-sided axillary lymph node with max SUV 2.7. Multiple small inguinal lymph nodes. Diffuse increased metabolic activity in the bony structures most notably the spine and pelvis as well as muscular activity.  PATHOLOGY RUL RESECTION & LYMPH NODE DISSECTION (10/12/15): Atypical carcinoid tumor 1.4 cm in greatest dimension. 10R lymph node with metastasis. Level 7 & 12R lymph nodes negative.  LABS 09/08/15 ANA:  Negative Anti-CCP:  <16 SSA:  <1.0 SSB:  <1.0  08/26/15 Alpha-1 antitrypsin:  173  09/23/13 IgE:  2.1 RAST Panel:  Negative     Assessment & Plan:  52 y.o. female with recent acute bronchitis post influenza exposure through her son. Known mild, intermittent asthma, GERD, and non-small cell lung cancer status post resection. Patient has no obvious pneumonia on x-ray imaging. She has no infectious symptoms at this time that would warrant repeat imaging either. I believe her ongoing cough  is likely due to a postviral cough syndrome from her acute bronchitis. Overall her asthma does not seem to be acutely worse but she does have a cough variant of asthma. I am trying the patient on cough suppression as well as restarting inhaled corticosteroid therapy. I instructed the patient contact my office if she develops any new symptoms or her symptoms do not significantly improve over the next few weeks.  1. Cough: Likely postviral cough. Treating with short course of inhaled corticosteroid and long-acting bronchodilator. Prescribing Tussigon pills for cough suppression given lack of response to [employer] in the past. 2. Mild, intermittent asthma: Restarting patient on Symbicort 160/4.5. Instructed patient to continue use as well as cough persists. 3. GERD: Remains asymptomatic. No new medications at this time. 4. Stage IIa NSCLC: Following with medical oncology. No new imaging at this time. 5. Health maintenance: Status post influenza vaccine October 2017, Pneumovax November 2014, & Provera December 2016. 6. Follow-up: Return to clinic in 1 year or sooner if needed.  Sonia Baller Ashok Cordia, M.D. [facility] Pulmonary & Critical Care Pager:  [phone] After 3pm or if no response, call [phone] 9:27 AM 12/29/16

## 2016-12-29 NOTE — Patient Instructions (Signed)
   Remember to use the cough pills sparingly because they have a narcotic in them that can make you sleepy and possibly impair your driving.  Use your Symbicort inhaler until your cough is totally gone.  Call me if you feel your cough is getting worse or doesn't totally resolve.  Let me know if you have any new breathing problems or questions before your next appointment.

## 2016-12-29 NOTE — Telephone Encounter (Signed)
IMAGING MAXILLOFACIAL CT LTD W/O 12/27/16 (per radiologist):  Clear sinuses.  No areas of bone resorption or sclerosis. Globes and orbits are unremarkable.  No soft tissue masses.  CXR PA/LAT 12/14/16 (personally reviewed by me):  No focal opacity or mass. No pleural effusion. Heart normal in size & mediastinum normal in contour.

## 2017-05-12 ENCOUNTER — Other Ambulatory Visit: Payer: Self-pay | Admitting: Pulmonary Disease

## 2017-05-16 ENCOUNTER — Other Ambulatory Visit: Payer: BLUE CROSS/BLUE SHIELD

## 2017-05-24 ENCOUNTER — Ambulatory Visit: Payer: BLUE CROSS/BLUE SHIELD | Admitting: Internal Medicine

## 2017-08-07 IMAGING — CT CT CHEST SUPER D W/O CM
1 of 3 series · 4 of 36 positions shown, 5 images · non-contrast
Comparison: Multiple exams, including 07/23/2015

CLINICAL DATA: Enlarging lung nodules

EXAM:
CT CHEST WITHOUT CONTRAST
TECHNIQUE: Multidetector CT imaging of the chest was performed using thin slice
collimation for electromagnetic bronchoscopy planning purposes,
without intravenous contrast.

[Series 602: cor · coronal · 0.71mm/px · 4 of 125 slices shown, 5 images]
[im 25/125  mediastinal]
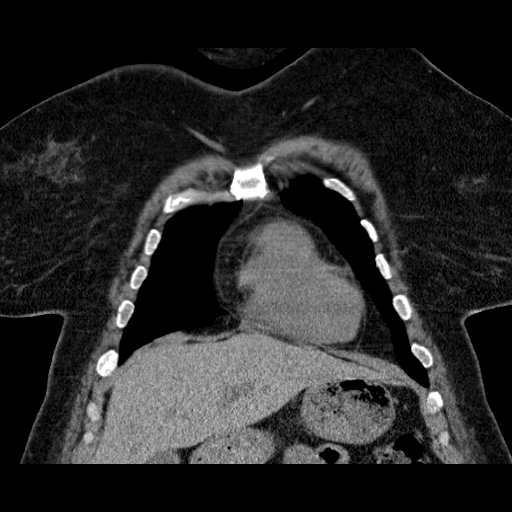
[im 25/125  lung]
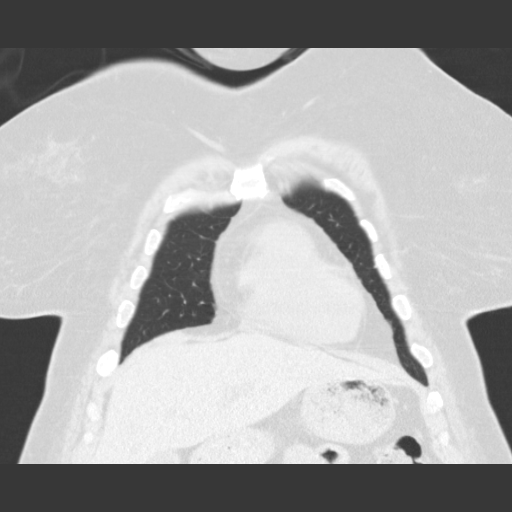
[im 50/125  lung]
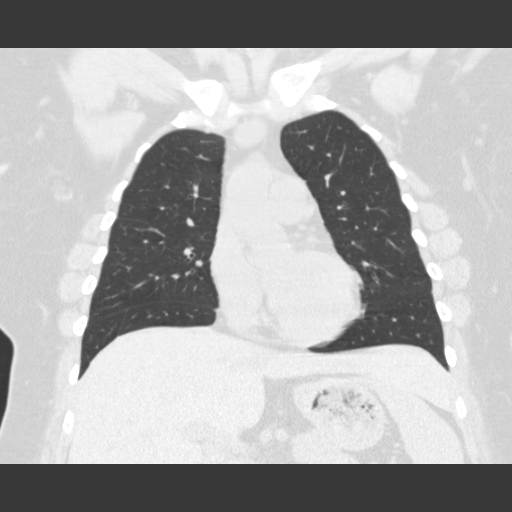
[im 75/125  lung]
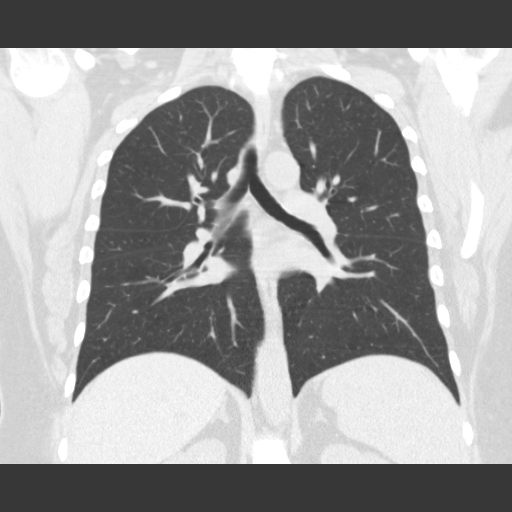
[im 100/125  lung]
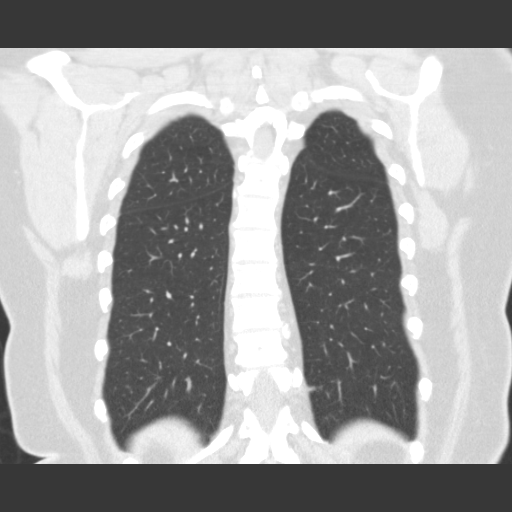

[4 of 36 positions shown; findings below may reference images not displayed]

FINDINGS: Mediastinum/Lymph Nodes: Small mediastinal lymph nodes are not
pathologically enlarged.

Lungs/Pleura: Different slice selection on today' s exam leads to a
different longitudinal measurement of the right upper lobe lesion,
but the lesion continues to measure 10 mm in thickness. Compensating
for the slice selection issue, the longitudinal size the lesion is
stable. The lesion has a branching distal margin and sleeves
slightly larger than on 02/07/2013. No other nodules. This lesion
was mildly hypermetabolic on prior PET-CT.

Upper abdomen: Unremarkable

Musculoskeletal: Small hemangioma in the T6 vertebra.
IMPRESSION: 1. Stable size of the right upper lobe lesion from 07/23/2015.
Although the morphologic characteristics could be seen in a
bronchocele, the fact that this was mildly hypermetabolic on PET-CT
indicates the likelihood of a low-grade neoplasm. No findings of
adenopathy or metastatic disease.

## 2018-04-04 ENCOUNTER — Ambulatory Visit: Payer: BLUE CROSS/BLUE SHIELD | Admitting: Acute Care

## 2018-04-11 ENCOUNTER — Ambulatory Visit (INDEPENDENT_AMBULATORY_CARE_PROVIDER_SITE_OTHER): Payer: BLUE CROSS/BLUE SHIELD | Admitting: Acute Care

## 2018-04-11 ENCOUNTER — Encounter: Payer: Self-pay | Admitting: Acute Care

## 2018-04-11 ENCOUNTER — Other Ambulatory Visit (INDEPENDENT_AMBULATORY_CARE_PROVIDER_SITE_OTHER): Payer: BLUE CROSS/BLUE SHIELD

## 2018-04-11 DIAGNOSIS — R059 Cough, unspecified: Secondary | ICD-10-CM

## 2018-04-11 DIAGNOSIS — J45991 Cough variant asthma: Secondary | ICD-10-CM

## 2018-04-11 DIAGNOSIS — C7A090 Malignant carcinoid tumor of the bronchus and lung: Secondary | ICD-10-CM

## 2018-04-11 DIAGNOSIS — R05 Cough: Secondary | ICD-10-CM

## 2018-04-11 DIAGNOSIS — D509 Iron deficiency anemia, unspecified: Secondary | ICD-10-CM

## 2018-04-11 LAB — CBC
HEMATOCRIT: 31.7 % — AB (ref 36.0–46.0)
HEMOGLOBIN: 9.2 g/dL — AB (ref 12.0–15.0)
MCHC: 29.1 g/dL — ABNORMAL LOW (ref 30.0–36.0)
Platelets: 193 10*3/uL (ref 150.0–400.0)
RBC: 5.51 Mil/uL — ABNORMAL HIGH (ref 3.87–5.11)
RDW: 20.7 % — AB (ref 11.5–15.5)
WBC: 7.7 10*3/uL (ref 4.0–10.5)

## 2018-04-11 LAB — IRON: Iron: 19 ug/dL — ABNORMAL LOW (ref 42–145)

## 2018-04-11 MED ORDER — BUDESONIDE-FORMOTEROL FUMARATE 160-4.5 MCG/ACT IN AERO: 2.0000 | INHALATION_SPRAY | Freq: Two times a day (BID) | RESPIRATORY_TRACT | 0 refills | Status: DC

## 2018-04-11 MED ORDER — HYDROCODONE-HOMATROPINE 5-1.5 MG PO TABS: 1.0000 | ORAL_TABLET | Freq: Four times a day (QID) | ORAL | 0 refills | Status: DC | PRN

## 2018-04-11 MED ORDER — OMEPRAZOLE 20 MG PO CPDR: 20.0000 mg | DELAYED_RELEASE_CAPSULE | Freq: Every day | ORAL | 0 refills | Status: AC

## 2018-04-11 NOTE — Progress Notes (Signed)
History of Present Illness Anna Larson is a 52 y.o. female with Acute Bronchitis with known Mild, Intermittent Asthma, GERD and  non-small cell lung cancer status post resection 10/2015. She is a former [name] patient.  HPI  Acute Bronchitis: Patient previously treated with Bactrim. Influenza exposure through her son.   Mild, intermittent asthma: Previously prescribed Symbicort.   04/11/2018  Pt. Presents for follow up. She was last seen by Dr. Ashok Cordia 12/2016 for acute bronchitis. Plan after that visit was as noted below  1. Cough: Likely postviral cough. Treating with short course of inhaled corticosteroid and long-acting bronchodilator. Prescribing Tussigon pills for cough suppression given lack of response to [employer] in the past. 2. Mild, intermittent asthma: Restarting patient on Symbicort 160/4.5. Instructed patient to continue use as well as cough persists. 3. GERD: Remains asymptomatic. No new medications at this time. 4. Stage IIa NSCLC: Following with medical oncology. No new imaging at this time. 5. Health maintenance: Status post influenza vaccine October 2017, Pneumovax November 2014, & Provera December 2016. 6. Follow-up: Return to clinic in 1 year or sooner if needed.    Pt. Returns for follow up. She states she has been doing well. She states she is no longer  complaint with her Symbicort 160/4.5. She states she cannot tell that it is making any difference with her cough so she stopped it. She states she had bronchitis again 11/2017, and was seen by her PCP.Marland Kitchen She was given 2 injections , a steroid injection and antibiotic injection. She said this  resolved her cough.   However,  she states her cough has come back. She states she has been coughing for the last 2 weeks. Cough is productive.Secretions are clear.She is not currently on any GERD treatment. She is not currently treating her cough. She has occasionally used Tussigon perles.She says the Alben Spittle works  better.She is throat clearing today. She states she is unaware of any reflux. She states cough is worse in the heat.She is concerned this may be recurrence of her lung cancer, as her initial symptoms at that time were cough. She is asking to have her iron level checked and her HGB checked. I have told her we will check her labs, but we will defer to her PCP for management of her anemia and iron deficiency. She verbalized understanding. She denies fever, chest pain, orthopnea or hemoptysis.   Test Results: 04/10/2018  CBC Latest Ref Rng & Units 04/11/2018 11/11/2016 04/26/2016  WBC 4.0 - 10.5 K/uL 7.7 7.1 4.6  Hemoglobin 12.0 - 15.0 g/dL 9.2(L) 13.2 13.5  Hematocrit 36.0 - 46.0 % 31.7(L) 41.9 42.8  Platelets 150.0 - 400.0 K/uL 193.0 171 186   Fe 04/10/2018>> 19 ug/dL   PFT 08/24/16: FVC 3.04 L (77%) FEV1 2.43 L (78%) FEV1/FVC 0.80 FEF 25-75 2.29 L (77%) negative bronchodilator response 01/20/16: FVC 3.16 L (80%) FEV1 2.53 L (81%) FEV1/FVC 0.80 FEF 25-75 2.40 L (80%) negative bronchodilator response TLC 4.66 L (84%) RV 80% ERV 27% DLCO corrected 84% 08/26/15: FVC 3.09 L (78%) FEV1 2.07 L (66%) FEV1/FVC 0.67 FEF 25-75 1.86 L (62%) negative bronchodilator response TLC 4.26 L (77%) RV 71% ERV 15% DLCO uncorrected 69% 04/04/13: FVC 3.20 L (80%) FEV1 2.50 L (77%) FEV1/FVC 0.78 FEF 25-75 2.46 L (75%)  IMAGING MAXILLOFACIAL CT LTD W/O 12/27/16 (per radiologist): Clear sinuses. No areas of bone resorption or sclerosis. Globes and orbits are unremarkable. No soft tissue masses.  CXR PA/LAT 12/14/16 :  Questionable right  lung hazy opacification. Radiology reported no focal opacity on their imaging monitor. No dense consolidation. No focal mass. No pleural effusion. Heart normal in size & mediastinum normal in contour.  CT CHEST W/ 04/26/16 (per radiologist):  No acute findings identified. No complications status post partial right upper lobectomy. Nonspecific soft tissue and thickening along the right lung  suture line is identified as above. This likely reflects postoperative change.  PET CT 08/10/15: Right upper lobe nodule with max SUV 3.8. No pathologic mediastinal or hilar adenopathy. Left-sided axillary lymph node with max SUV 2.7. Multiple small inguinal lymph nodes. Diffuse increased metabolic activity in the bony structures most notably the spine and pelvis as well as muscular activity.  PATHOLOGY RUL RESECTION & LYMPH NODE DISSECTION (10/12/15): Atypical carcinoid tumor 1.4 cm in greatest dimension. 10R lymph node with metastasis. Level 7 & 12R lymph nodes negative.  LABS 09/08/15 ANA:  Negative Anti-CCP:  <16 SSA:  <1.0 SSB:  <1.0  08/26/15 Alpha-1 antitrypsin:  173  09/23/13 IgE:  2.1 RAST Panel:  Negative     CBC Latest Ref Rng & Units 04/11/2018 11/11/2016 04/26/2016  WBC 4.0 - 10.5 K/uL 7.7 7.1 4.6  Hemoglobin 12.0 - 15.0 g/dL 9.2(L) 13.2 13.5  Hematocrit 36.0 - 46.0 % 31.7(L) 41.9 42.8  Platelets 150.0 - 400.0 K/uL 193.0 171 186    BMP Latest Ref Rng & Units 11/11/2016 04/26/2016 10/28/2015  Glucose 70 - 140 mg/dl 88 93 95  BUN 7.0 - 26.0 mg/dL 8.3 5.7(L) 5.3(L)  Creatinine 0.6 - 1.1 mg/dL 0.7 0.7 0.8  Sodium 136 - 145 mEq/L 141 140 139  Potassium 3.5 - 5.1 mEq/L 4.2 3.9 3.9  Chloride 101 - 111 mmol/L - - -  CO2 22 - 29 mEq/L 26 25 24   Calcium 8.4 - 10.4 mg/dL 9.3 8.6 9.3    BNP No results found for: BNP  ProBNP No results found for: PROBNP  PFT    Component Value Date/Time   FEV1PRE 2.43 08/24/2016 1429   FEV1POST 2.47 08/24/2016 1429   FVCPRE 3.04 08/24/2016 1429   FVCPOST 3.01 08/24/2016 1429   TLC 4.66 01/20/2016 1458   DLCOUNC 24.61 01/20/2016 1458   PREFEV1FVCRT 80 08/24/2016 1429   PSTFEV1FVCRT 82 08/24/2016 1429    No results found.   Past medical hx Past Medical History:  Diagnosis Date  . Allergic rhinitis   . Anemia    Takes Ferrous sulfate  . Arthritis   . Asthma   . COPD (chronic obstructive pulmonary disease) ([place])   .  Cough   . GERD (gastroesophageal reflux disease)   . History of kidney stones    x 3 episodes  . Hx of adenomatous + sessile serrated colonic polyps 09/22/2015  . Lung nodule < 6cm on CT   . Pneumonia    hx of  . Primary atypical carcinoid tumor of right lung [facility])    Right upper lobe segmentectomy 2016- T1N1     Social History   Tobacco Use  . Smoking status: Former Research scientist (life sciences)  . Smokeless tobacco: Never Used  . Tobacco comment: both mothers smoked: States she smoked as a teen, very little  Substance Use Topics  . Alcohol use: Yes    Alcohol/week: 0.0 oz    Comment: occasional  . Drug use: No    Anna Larson reports that she has quit smoking. She has never used smokeless tobacco. She reports that she drinks alcohol. She reports that she does not use drugs.  Tobacco Cessation: Remote  smoking as a teen  Past surgical hx, Family hx, Social hx all reviewed.  Current Outpatient Medications on File Prior to Visit  Medication Sig  . chlorpheniramine (CHLOR-TRIMETON) 4 MG tablet Take 3 tablets (12 mg total) by mouth at bedtime.  Marland Kitchen etodolac (LODINE) 400 MG tablet Take 400 mg by mouth daily.   . ferrous sulfate 325 (65 FE) MG tablet Take 1 tablet (325 mg total) by mouth 3 (three) times daily with meals.   No current facility-administered medications on file prior to visit.      Allergies  Allergen Reactions  . Codeine Rash    Review Of Systems:  Constitutional:   No  weight loss, night sweats,  Fevers, chills, fatigue, or  lassitude.  HEENT:   No headaches,  Difficulty swallowing,  Tooth/dental problems, or  Sore throat,                No sneezing, itching, ear ache, nasal congestion, post nasal drip,   CV:  No chest pain,  Orthopnea, PND, swelling in lower extremities, anasarca, dizziness, palpitations, syncope.   GI  No heartburn, indigestion, abdominal pain, nausea, vomiting, diarrhea, change in bowel habits, loss of appetite, bloody stools.   Resp: No shortness of breath  with exertion or at rest.  + excess mucus, no productive cough,  No non-productive cough,  No coughing up of blood.  No change in color of mucus.  No wheezing.  No chest wall deformity  Skin: no rash or lesions.  GU: no dysuria, change in color of urine, no urgency or frequency.  No flank pain, no hematuria   MS:  No joint pain or swelling.  No decreased range of motion.  No back pain.  Psych:  No change in mood or affect. No depression or anxiety.  No memory loss.   Vital Signs BP 120/80 (BP Location: Right Arm, Cuff Size: Normal)   Pulse 73   Ht 5\' 7"  (1.702 m)   Wt 263 lb 9.6 oz (119.6 kg)   SpO2 100%   BMI 41.29 kg/m    Physical Exam:  General- No distress,  A&Ox3, pleasant ENT: No sinus tenderness, TM clear, pale nasal mucosa, no oral exudate,no post nasal drip, no LAN Cardiac: S1, S2, regular rate and rhythm, no murmur Chest: No wheeze/ rales/ dullness; no accessory muscle use, no nasal flaring, no sternal retractions, diminished per RUL Abd.: Soft Non-tender, ND, BS +, Body mass index is 41.29 kg/m. Ext: No clubbing cyanosis, edema Neuro:  normal strength Skin: No rashes, warm and dry Psych: normal mood and behavior   Assessment/Plan  Cough variant asthma Recurrent cough Not using Symbicort as it " Makes no difference to breathing" No GERD treatment Plan: We will order a CT chest without  Contrast. Follow up with Dr. Earlie Server >> oncology. We will renew Tussigon tablets for cough Continue Chlorpheniramine at bedtime as you have been doing. Consider re-staring Symbicort 2 puffs once daily Call us if you want to restart treatment. We will do a trial of omeprazole 20 mg daily x 4 weeks  Add pepcid 20 mg at bedtime. X 4 weeks ( Generic  Famatodine)  Follow up with [name] in 6 months to assume care Refer to allergy for allergy testing. Please contact office for sooner follow up if symptoms do not improve or worsen or seek emergency care       Primary  atypical carcinoid tumor of right lung ([place]) Cough with concern of recurrence per patient Plan:  CT Chest Follow up with oncology Dr. Earlie Server   Anemia, iron deficiency HGB down 4 grams in 1 year FE level remains low Plan: CBC today Iron level today We will call you with results. Follow up with [name] in 6 months to assume care Please contact office for sooner follow up if symptoms do not improve or worsen or seek emergency care        Magdalen Spatz, NP 04/11/2018  9:07 PM

## 2018-04-11 NOTE — Assessment & Plan Note (Addendum)
Recurrent cough Not using Symbicort as it " Makes no difference to breathing" No GERD treatment Plan: We will order a CT chest without  Contrast. Follow up with Dr. Earlie Server >> oncology. We will renew Tussigon tablets for cough Continue Chlorpheniramine at bedtime as you have been doing. Consider re-staring Symbicort 2 puffs once daily Call us if you want to restart treatment. We will do a trial of omeprazole 20 mg daily x 4 weeks  Add pepcid 20 mg at bedtime. X 4 weeks ( Generic  Famatodine)  Follow up with [name] in 6 months to assume care Refer to allergy for allergy testing. Please contact office for sooner follow up if symptoms do not improve or worsen or seek emergency care

## 2018-04-11 NOTE — Assessment & Plan Note (Signed)
HGB down 4 grams in 1 year FE level remains low Plan: CBC today Iron level today We will call you with results. Follow up with [name] in 6 months to assume care Please contact office for sooner follow up if symptoms do not improve or worsen or seek emergency care

## 2018-04-11 NOTE — Assessment & Plan Note (Signed)
Cough with concern of recurrence per patient Plan: CT Chest Follow up with oncology Dr. Earlie Server

## 2018-04-11 NOTE — Patient Instructions (Addendum)
It is good to see you today. We will order a CT chest without  Contrast. Follow up with Dr. Earlie Server >> oncology. We will renew Tussigon tablets for cough Continue Chlorpheniramine at bedtime as you have been doing. Consider re-staring Symbicort 2 puffs once daily Call us if you want to restart treatment. We will do a trial of omeprazole 20 mg daily x 4 weeks  Add pepcid 20 mg at bedtime. X 4 weeks ( Generic  Famatodine)  CBC today Iron level today We will call you with results. Follow up with [name] in 6 months to assume care Refer to allergy for allergy testing. Please contact office for sooner follow up if symptoms do not improve or worsen or seek emergency care

## 2018-04-12 ENCOUNTER — Telehealth: Payer: Self-pay | Admitting: Acute Care

## 2018-04-13 NOTE — Telephone Encounter (Signed)
Please see if there is a pharmacy that has this medication as the patient states that the other medications do not work for her. Thanks

## 2018-04-13 NOTE — Progress Notes (Signed)
LMTCB 1 on preferred phone number listed for patient.

## 2018-04-13 NOTE — Telephone Encounter (Signed)
Patient returning call- She can be reached at [phone] -pr

## 2018-04-13 NOTE — Telephone Encounter (Signed)
ATC to call [facility]- pharmacy currently closed.   Will route to triage to ensure follow up after 9:00

## 2018-04-13 NOTE — Telephone Encounter (Signed)
Called and spoke with Anna Larson from the pharmacy she stated that they do not have the tussigon tablets and do not get them. She also stated that [facility] them. [facility] and [facility] [place] and she stated that they do not carry this medication either.   Called and spoke with patient, advised her of this and let her know we are working on either switching medications or finding a pharmacy that will fill this. While on the phone the patient wanted her results from her iron levels. Results given. Patient verbalized understanding and stated she would wait to hear from [place] regarding medication.    SG please advise, do we need to switch the medication or find a pharmacy that will fill this for the patient. Thanks.

## 2018-04-13 NOTE — Telephone Encounter (Signed)
Attempted to contact [facility]. I did not receive an answer. There was no option for me to leave a message. Will try back.

## 2018-04-13 NOTE — Telephone Encounter (Signed)
Spoke with pt. She states that she spoke with [facility] and they are going to order this medication for her. Advised her to let us know if she continues to have an issue getting this prescription. Nothing further was needed at this time.

## 2018-04-26 ENCOUNTER — Ambulatory Visit (INDEPENDENT_AMBULATORY_CARE_PROVIDER_SITE_OTHER)
Admission: RE | Admit: 2018-04-26 | Discharge: 2018-04-26 | Disposition: A | Discharge status: Home / Self Care | Payer: BLUE CROSS/BLUE SHIELD | Source: Ambulatory Visit | Attending: Acute Care | Admitting: Acute Care

## 2018-04-26 DIAGNOSIS — R05 Cough: Secondary | ICD-10-CM

## 2018-04-26 DIAGNOSIS — R059 Cough, unspecified: Secondary | ICD-10-CM

## 2018-04-30 ENCOUNTER — Telehealth: Payer: Self-pay | Admitting: Acute Care

## 2018-04-30 NOTE — Telephone Encounter (Signed)
Called and spoke with patient regarding results.  Informed the patient of results and recommendations today. Pt verbalized understanding and denied any questions or concerns at this time.  Nothing further needed.  

## 2018-05-03 ENCOUNTER — Telehealth: Payer: Self-pay | Admitting: Acute Care

## 2018-05-03 DIAGNOSIS — R059 Cough, unspecified: Secondary | ICD-10-CM

## 2018-05-03 DIAGNOSIS — R05 Cough: Secondary | ICD-10-CM

## 2018-05-03 NOTE — Telephone Encounter (Signed)
Please call patient and let her know I had Dr. Earlie Server review her scan. She will need a 1 year follow up. Please place order for  CT Chest w/o contrast for 04/2018 . She can follow up with [place] or with Dr. Julien Nordmann , but will need a return appointment after the scan to review results. Thanks

## 2018-05-03 NOTE — Telephone Encounter (Signed)
-----   Message from Curt Bears, MD sent at 05/01/2018 10:06 PM EDT ----- She is good. You can see her back in one year. Or ask her to call my office for an appointment in one year with scan. Thank you.  ----- Message ----- From: Magdalen Spatz, NP Sent: 05/01/2018   4:59 PM To: Curt Bears, MD  Dr. Earlie Server.  I wanted you to see the 6 month follow up CT on Ms. Desa. She does not have a scheduled appointment to follow up with you. Thanks so much.

## 2018-05-07 NOTE — Telephone Encounter (Signed)
Attempted to call pt but unable to reach her. Left message for pt to return call. 

## 2018-05-07 NOTE — Telephone Encounter (Signed)
Called and spoke with pt letting her know the information stated by SG.  Pt expressed understanding and stated she would f/u 1 year at our office after CT scan is done.  Reminder has been placed in pt's chart for a f/u appt in 1 year after CT scan.  Nothing further needed.

## 2018-05-07 NOTE — Telephone Encounter (Signed)
She can follow up with either [place] or [facility]. She just has to follow up after the scan with one of [place]. She needs to verbalize that she understands that.  Place the order as a 1 year follow up. We did a without contrast this year, we can do a with contrast in 04/2019. Thanks

## 2018-05-07 NOTE — Telephone Encounter (Signed)
Patient returned phone call; pt contact # [phone]

## 2018-05-07 NOTE — Telephone Encounter (Signed)
Called and spoke with pt letting her know conversation from [place] and Dr. Earlie Server.  Judson Roch, please clarify if you meant to say repeat CT w/o contrast 04/2019.  Also, does pt need to have the f/u appt with our office or can pt schedule appt either at our office or Dr. Earlie Server, or does pt need an appt with both practices?

## 2018-05-25 ENCOUNTER — Ambulatory Visit: Payer: Self-pay | Admitting: Allergy

## 2018-06-01 ENCOUNTER — Ambulatory Visit (INDEPENDENT_AMBULATORY_CARE_PROVIDER_SITE_OTHER): Payer: BLUE CROSS/BLUE SHIELD | Admitting: Allergy

## 2018-06-01 ENCOUNTER — Encounter: Payer: Self-pay | Admitting: Allergy

## 2018-06-01 VITALS — BP 100/60 | HR 68 | Temp 98.2°F | Ht 66.5 in | Wt 262.0 lb

## 2018-06-01 DIAGNOSIS — R059 Cough, unspecified: Secondary | ICD-10-CM

## 2018-06-01 DIAGNOSIS — R05 Cough: Secondary | ICD-10-CM

## 2018-06-01 DIAGNOSIS — R0982 Postnasal drip: Secondary | ICD-10-CM

## 2018-06-01 NOTE — Patient Instructions (Addendum)
Cough  - history of mild intermittent asthma followed by pulmonary and non-small cell lung cancer followed by oncology  - cough may have an environmental allergy component  - environmental allergy skin testing today is equivocal to mold on intradermal testing thus will obtain environmental serum IgE panel to complete allergy testing.    - continue current recommendation from pulmonary with Symbicort and as needed albuterol.    - will have you try Dymista (combination nasal spray of flonase and astelin) which helps treat nasal congestion and drainage.  Use 1 spray each nostril twice a day, to determine if you have any component of post-nasal drainage that may lead to cough   Follow-up as needed

## 2018-06-01 NOTE — Progress Notes (Signed)
New Patient Note  RE: Anna Larson MRN: [id number] DOB: 1966-07-16 Date of Office Visit: 06/01/2018  Referring provider: Magdalen Spatz, NP Primary care provider: Windell Hummingbird, PA-C  Chief Complaint: allergy testing  History of present illness: Anna Larson is a 52 y.o. female presenting today for consultation for possible allergies. She has history of mild intermittent asthma and NSCLC and is followed by pulmonology Eric Form NP) and hematology/oncology (Dr. Julien Nordmann).  She recently NP [place] on 04/11/18 who recommended she has allergy testing performed to determine if allergies was a component of her cough.    She states cough is sometimes dry and sometimes productive.  She states every year around Jan/Feb she gets bronchitis.  Cough is worse when she is hot.  She is on symbicort which she does not like to take as she can't tell a difference when she is one it vs off it.   She takes chlorpheniramine daily for past several years.  She states she does feel her cough has been worse the past 3 days off antihistamine for testing today.  She does report she clears her throat a lot.  She has not used any nasal sprays in many years.   She has had allergy testing before around 2014 and states everything was negative at that time.    She states zantac and prilosec daily for reflux control.  No history of eczema or food allergy.    Review of systems: Review of Systems  Constitutional: Negative for chills, fever and malaise/fatigue.  HENT: Negative for congestion, ear discharge, ear pain, nosebleeds and sore throat.   Eyes: Negative for pain, discharge and redness.  Respiratory: Positive for cough. Negative for hemoptysis, sputum production, shortness of breath and wheezing.   Cardiovascular: Negative for chest pain.  Gastrointestinal: Negative for abdominal pain, constipation, diarrhea, heartburn, nausea and vomiting.  Musculoskeletal: Negative for joint pain.  Skin: Negative for itching  and rash.  Neurological: Negative for headaches.    All other systems negative unless noted above in HPI  Past medical history: Past Medical History:  Diagnosis Date  . Allergic rhinitis   . Anemia    Takes Ferrous sulfate  . Arthritis   . Asthma   . COPD (chronic obstructive pulmonary disease) ([place])   . Cough   . GERD (gastroesophageal reflux disease)   . History of kidney stones    x 3 episodes  . Hx of adenomatous + sessile serrated colonic polyps 09/22/2015  . Lung nodule < 6cm on CT   . Pneumonia    hx of  . Primary atypical carcinoid tumor of right lung [facility])    Right upper lobe segmentectomy 2016- T1N1    Past surgical history: Past Surgical History:  Procedure Laterality Date  . CESAREAN SECTION    . COLONOSCOPY WITH PROPOFOL N/A 09/22/2015   Procedure: COLONOSCOPY WITH PROPOFOL;  Surgeon: Gatha Mayer, MD;  Location: [facility];  Service: Endoscopy;  Laterality: N/A;  . ESOPHAGOGASTRODUODENOSCOPY (EGD) WITH PROPOFOL N/A 09/22/2015   Procedure: ESOPHAGOGASTRODUODENOSCOPY (EGD) WITH PROPOFOL;  Surgeon: Gatha Mayer, MD;  Location: [facility];  Service: Endoscopy;  Laterality: N/A;  . LITHOTRIPSY     x2  . SEGMENTECOMY Right 10/12/2015   Procedure: RIGHT UPPER LOBE SEGMENTECTOMY;  Surgeon: [name], MD;  Location: [facility];  Service: Thoracic;  Laterality: Right;  . TUBAL LIGATION    . VIDEO ASSISTED THORACOSCOPY Right 10/12/2015   Procedure: VIDEO ASSISTED THORACOSCOPY;  Surgeon: Revonda Standard  Roxan Hockey, MD;  Location: [facility];  Service: Thoracic;  Laterality: Right;    Family history:  Family History  Problem Relation Age of Onset  . Bone cancer Maternal Grandmother   . Cancer Maternal Grandmother        bone  . Multiple sclerosis Mother   . GER disease Father   . Emphysema Maternal Grandfather   . Cancer Maternal Grandfather        emphysema  . Prostate cancer Paternal Grandfather     Social history:  Tobacco Use  . Smoking status:  Former Research scientist (life sciences)  . Smokeless tobacco: Never Used  . Tobacco comment: both mothers smoked: States she smoked as a teen, very little  Social History Narrative   Lives in a home with oil heating and central cooling.  No pets in the home.  Has an outdoor dog. No concern for water damage, mildew or roaches in the home.  She is a Scientist, water quality.  Enjoys cross stitching.     Medication List: Allergies as of 06/01/2018      Reactions   Codeine Rash      Medication List        Accurate as of 06/01/18  3:59 PM. Always use your most recent med list.          budesonide-formoterol 160-4.5 MCG/ACT inhaler Commonly known as:  SYMBICORT Inhale 2 puffs into the lungs 2 (two) times daily.   chlorpheniramine 4 MG tablet Commonly known as:  CHLOR-TRIMETON Take 3 tablets (12 mg total) by mouth at bedtime.   etodolac 400 MG tablet Commonly known as:  LODINE Take 400 mg by mouth daily.   HYDROcodone-Homatropine 5-1.5 MG Tabs Commonly known as:  TUSSIGON Take 1 tablet by mouth every 6 (six) hours as needed.   omeprazole 20 MG capsule Commonly known as:  PRILOSEC Take 1 capsule (20 mg total) by mouth daily.   ranitidine 150 MG tablet Commonly known as:  ZANTAC Take 150 mg by mouth 2 (two) times daily.       Known medication allergies: Allergies  Allergen Reactions  . Codeine Rash     Physical examination: Blood pressure 100/60, pulse 68, temperature 98.2 F (36.8 C), temperature source Oral, height 5' 6.5" (1.689 m), weight 262 lb (118.8 kg), last menstrual period 05/12/2018, SpO2 98 %.  General: Alert, interactive, in no acute distress. HEENT: PERRLA, TMs pearly gray, turbinates mildly edematous without discharge, post-pharynx non erythematous. Neck: Supple without lymphadenopathy. Lungs: Clear to auscultation without wheezing, rhonchi or rales. {no increased work of breathing. CV: Normal S1, S2 without murmurs. Abdomen: Nondistended, nontender. Skin: Warm and dry, without lesions or  rashes. Extremities:  No clubbing, cyanosis or edema. Neuro:   Grossly intact.  Diagnositics/Labs: Spirometry: FEV1 2.23L 73%, FVC 2.97L 76%  Mild restrictive pattern  Allergy testing: environmental allergy skin prick testing today is negative.  Intradermal testing is equivocal to mold mix 2 Allergy testing results were read and interpreted by provider, documented by clinical staff.   Assessment and plan:   Cough with PND  - history of mild intermittent asthma followed by pulmonary and non-small cell lung cancer followed by oncology  - cough may have an environmental allergy component  - environmental allergy skin testing today is equivocal to mold on intradermal testing thus will obtain environmental serum IgE panel to complete allergy testing.    - continue current recommendation from pulmonary with Symbicort and as needed albuterol.    - will have you try Dymista (combination nasal spray  of flonase and astelin) which helps treat nasal congestion and drainage.  Use 1 spray each nostril twice a day, to determine if you have any component of post-nasal drainage that may lead to cough   Follow-up as needed pending labs   I appreciate the opportunity to take part in Lulla's care. Please do not hesitate to contact me with questions.  Sincerely,   Prudy Feeler, MD Allergy/Immunology [facility] [place] [facility] [place]

## 2018-06-05 LAB — ALLERGENS W/TOTAL IGE AREA 2
Alternaria Alternata IgE: <0.1 kU/L
Cedar, Mountain IgE: <0.1 kU/L
Cockroach, German IgE: <0.1 kU/L
Common Silver Birch IgE: <0.1 kU/L
D Farinae IgE: <0.1 kU/L
D Pteronyssinus IgE: <0.1 kU/L
Maple/Box Elder IgE: <0.1 kU/L
Oak, White IgE: <0.1 kU/L
Pigweed, Rough IgE: <0.1 kU/L
Sheep Sorrel IgE Qn: <0.1 kU/L

## 2018-06-06 ENCOUNTER — Other Ambulatory Visit: Payer: Self-pay

## 2018-06-06 MED ORDER — AZELASTINE-FLUTICASONE 137-50 MCG/ACT NA SUSP: 2.0000 | Freq: Every day | NASAL | 5 refills | Status: DC

## 2018-06-08 ENCOUNTER — Telehealth: Payer: Self-pay

## 2018-06-08 MED ORDER — AZELASTINE HCL 0.1 % NA SOLN: 2.0000 | Freq: Two times a day (BID) | NASAL | 5 refills | Status: DC

## 2018-06-08 MED ORDER — FLUTICASONE PROPIONATE 50 MCG/ACT NA SUSP: 2.0000 | Freq: Every day | NASAL | 5 refills | Status: DC

## 2018-06-08 NOTE — Telephone Encounter (Signed)
Patient is calling to see if we can switch her meds to something else instead of dymista due to it not being covered.   Please Advise

## 2018-06-08 NOTE — Telephone Encounter (Signed)
Prescription has been split and patient is aware.

## 2018-09-05 ENCOUNTER — Telehealth: Payer: Self-pay | Admitting: Pulmonary Disease

## 2018-09-05 NOTE — Telephone Encounter (Signed)
Called and spoke with patient advised her of last iron level from 6.5.19. Patient verbalized understanding. Nothing further needed.

## 2018-10-10 ENCOUNTER — Ambulatory Visit: Payer: BLUE CROSS/BLUE SHIELD | Admitting: Pulmonary Disease

## 2018-10-24 ENCOUNTER — Ambulatory Visit: Payer: BLUE CROSS/BLUE SHIELD | Admitting: Pulmonary Disease

## 2018-10-24 ENCOUNTER — Ambulatory Visit (INDEPENDENT_AMBULATORY_CARE_PROVIDER_SITE_OTHER): Payer: BLUE CROSS/BLUE SHIELD | Admitting: Acute Care

## 2018-10-24 ENCOUNTER — Encounter: Payer: Self-pay | Admitting: Acute Care

## 2018-10-24 DIAGNOSIS — D509 Iron deficiency anemia, unspecified: Secondary | ICD-10-CM

## 2018-10-24 DIAGNOSIS — J45991 Cough variant asthma: Secondary | ICD-10-CM

## 2018-10-24 DIAGNOSIS — C7A090 Malignant carcinoid tumor of the bronchus and lung: Secondary | ICD-10-CM | POA: Diagnosis not present

## 2018-10-24 NOTE — Patient Instructions (Addendum)
It is good to see you today Follow up with Dr. Earlie Server >> oncology after this CT in June 2020. Continue Chlorpheniramine at bedtime as you have been doing. Consider  Symbicort 2 puffs once daily Rinse mouth after use Continue zantac and prilosec daily for reflux control. Continue Flonase as you have been doing Continue iron tablets as prescribed by Windell Hummingbird, PA Follow up CT Chest 04/2019, please schedule today Follow up with Dr. Earlie Server after CT chest>> message sent to [place] Blizzard 10/24/2018 Follow up with [name] in 6 months, consider repeat PFT's at that time Please contact office for sooner follow up if symptoms do not improve or worsen or seek emergency care

## 2018-10-24 NOTE — Assessment & Plan Note (Addendum)
Stable with no indication of recurrence 04/2018 Plan>> per Dr. Lew Dawes recommendations Follow up CT Chest 04/2019, please schedule today Follow up with Dr. Earlie Server after CT chest>> message sent to Norton Blizzard 10/24/2018

## 2018-10-24 NOTE — Assessment & Plan Note (Signed)
Continue iron tablets as prescribed by Windell Hummingbird, PA Follow up labs/ monitoring per above provider as she is treating Follow up with [name] in 6 months, consider repeat PFT's at that time Please contact office for sooner follow up if symptoms do not improve or worsen or seek emergency care

## 2018-10-24 NOTE — Assessment & Plan Note (Addendum)
Stable interval until 11.2019 when she had bronchitis Treated by her PCP with antibiotic and steroid injection Compliant with Symbicort Plan: Continue Chlorpheniramine at bedtime as you have been doing. Continue  Symbicort 2 puffs once daily Rinse mouth after use Continue zantac and prilosec daily for reflux control. Continue Flonase as you have been doing Be aware of your flare symptoms and seek care early on, as opposed to waiting until you get  worse Follow up with [name] in 6 months, consider repeat PFT's at that time Please contact office for sooner follow up if symptoms do not improve or worsen or seek emergency care

## 2018-10-24 NOTE — Progress Notes (Signed)
History of Present Illness Anna Larson is a 52 y.o. female former smoker with known Mild, Intermittent Asthma, GERD and  non-small cell lung cancer status post resection 10/2015. 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 She has been re-assigned to Dr. Vaughan Larson.  HPI Acute Bronchitis:Patient previously treated with Bactrim.    Mild, intermittent asthma: Previously prescribed Symbicort.  10/24/2018 6 month follow up:  Pt. Presents for 6 month follow up. She states she has been doing well. She did follow up with allergy 05/2018. Environmental allergy skin prick testing was  negative.  Intradermal testing is equivocal to mold mix 2.They asked her to continue Symbicort and albuterol prn. She did a therapeutic trial of Dymista for her drainage and congestion. She had been doing well until she had a bout of bronchitis right after Thanksgiving . She was treated at [facility] with Z pack and and a steroid and antibiotic injections. She was not better 12/11 so she went back to [facility] and got another steroid injection and antibiotic injection. She states she is better after the second round of medications. She is using the Symbicort daily. She is compliant with Protonix and zantac daily. She does use Dymista as needed.Cough is worse when she is hot.She denies fever, chest pain, orthopnea or hemoptysis. She has not had to use her rescue inhaler.She had Prevnar 13 in 2016,  she had pneumococcal 23 in 2014. She will be due 5 years after the last injection, or at age 52.  Of note she did request additional cough medication. Per the [facility] report, she just had a prescription for Hycodan  filled 12/15 ( 120 cc's) I told her this was too soon. That she was not using the medication as it had been prescribed to be through 120 cc's in less than 3 days.  Overdose Risk Score 400/999   Test Results: Fe 04/10/2018>> 19 ug/dL   PFT 08/24/16: FVC 3.04 L (77%) FEV1 2.43 L (78%) FEV1/FVC 0.80 FEF 25-75 2.29 L (77%) negative  bronchodilator response 01/20/16: FVC 3.16 L (80%) FEV1 2.53 L (81%) FEV1/FVC 0.80 FEF 25-75 2.40 L (80%) negative bronchodilator response TLC 4.66 L (84%) RV 80% ERV 27% DLCO corrected 84% 08/26/15: FVC 3.09 L (78%) FEV1 2.07 L (66%) FEV1/FVC 0.67 FEF 25-75 1.86 L (62%) negative bronchodilator response TLC 4.26 L (77%) RV 71% ERV 15% DLCO uncorrected 69% 04/04/13: FVC 3.20 L (80%) FEV1 2.50 L (77%) FEV1/FVC 0.78 FEF 25-75 2.46 L (75%)  IMAGING CT Chest 04/2018 New mild subpleural opacity in the right upper lobe is predominately linear and likely inflammatory or scarring. There is no consolidation or suspicious pulmonary nodule. No evidence of local recurrence or metastatic disease post partial right upper lobe resection. No adenopathy.   MAXILLOFACIAL CT LTD W/O 12/27/16 (per radiologist): Clear sinuses. No areas of bone resorption or sclerosis. Globes and orbits are unremarkable. No soft tissue masses.  CXR PA/LAT 12/14/16 :Questionable right lung hazy opacification. Radiology reported no focal opacity on their imaging monitor. No dense consolidation.No focal mass. No pleural effusion. Heart normal in size & mediastinum normal in contour.  CT CHEST W/ 04/26/16 (per radiologist): No acute findings identified. No complications status post partial right upper lobectomy. Nonspecific soft tissue and thickening along the right lung suture line is identified as above. This likely reflects postoperative change.  PET CT 08/10/15: Right upper lobe nodule with max SUV 3.8. No pathologic mediastinal or hilar adenopathy. Left-sided axillary lymph node with max SUV 2.7. Multiple small inguinal lymph  nodes. Diffuse increased metabolic activity in the bony structures most notably the spine and pelvis as well as muscular activity.  PATHOLOGY RUL RESECTION & LYMPH NODE DISSECTION (10/12/15): Atypical carcinoid tumor 1.4 cm in greatest dimension. 10R lymph node with metastasis. Level 7 & 12R lymph  nodes negative.  LABS 09/08/15 ANA: Negative Anti-CCP: <16 SSA: <1.0 SSB: <1.0  08/26/15 Alpha-1 antitrypsin: 173  09/23/13 IgE: 2.1 RAST Panel: Negative  Non-allergy related symptoms    CBC Latest Ref Rng & Units 04/11/2018 11/11/2016 04/26/2016  WBC 4.0 - 10.5 K/uL 7.7 7.1 4.6  Hemoglobin 12.0 - 15.0 g/dL 9.2(L) 13.2 13.5  Hematocrit 36.0 - 46.0 % 31.7(L) 41.9 42.8  Platelets 150.0 - 400.0 K/uL 193.0 171 186    BMP Latest Ref Rng & Units 11/11/2016 04/26/2016 10/28/2015  Glucose 70 - 140 mg/dl 88 93 95  BUN 7.0 - 26.0 mg/dL 8.3 5.7(L) 5.3(L)  Creatinine 0.6 - 1.1 mg/dL 0.7 0.7 0.8  Sodium 136 - 145 mEq/L 141 140 139  Potassium 3.5 - 5.1 mEq/L 4.2 3.9 3.9  Chloride 101 - 111 mmol/L - - -  CO2 22 - 29 mEq/L 26 25 24   Calcium 8.4 - 10.4 mg/dL 9.3 8.6 9.3    BNP No results found for: BNP  ProBNP No results found for: PROBNP  PFT    Component Value Date/Time   FEV1PRE 2.43 08/24/2016 1429   FEV1POST 2.47 08/24/2016 1429   FVCPRE 3.04 08/24/2016 1429   FVCPOST 3.01 08/24/2016 1429   TLC 4.66 01/20/2016 1458   DLCOUNC 24.61 01/20/2016 1458   PREFEV1FVCRT 80 08/24/2016 1429   PSTFEV1FVCRT 82 08/24/2016 1429    No results found.   Past medical hx Past Medical History:  Diagnosis Date  . Allergic rhinitis   . Anemia    Takes Ferrous sulfate  . Arthritis   . Asthma   . COPD (chronic obstructive pulmonary disease) ([place])   . Cough   . GERD (gastroesophageal reflux disease)   . History of kidney stones    x 3 episodes  . Hx of adenomatous + sessile serrated colonic polyps 09/22/2015  . Lung nodule < 6cm on CT   . Pneumonia    hx of  . Primary atypical carcinoid tumor of right lung [facility])    Right upper lobe segmentectomy 2016- T1N1     Social History   Tobacco Use  . Smoking status: Former Research scientist (life sciences)  . Smokeless tobacco: Never Used  . Tobacco comment: both mothers smoked: States she smoked as a teen, very little  Substance Use Topics  .  Alcohol use: Yes    Alcohol/week: 0.0 standard drinks    Comment: occasional  . Drug use: No    Anna.Larson reports that she has quit smoking. She has never used smokeless tobacco. She reports current alcohol use. She reports that she does not use drugs.  Tobacco Cessation: Former smoker with a remote smoking history  Past surgical hx, Family hx, Social hx all reviewed.  Current Outpatient Medications on File Prior to Visit  Medication Sig  . azelastine (ASTELIN) 0.1 % nasal spray Place 2 sprays into both nostrils 2 (two) times daily.  . budesonide-formoterol (SYMBICORT) 160-4.5 MCG/ACT inhaler Inhale 2 puffs into the lungs 2 (two) times daily.  . chlorpheniramine (CHLOR-TRIMETON) 4 MG tablet Take 3 tablets (12 mg total) by mouth at bedtime.  . fluticasone (FLONASE) 50 MCG/ACT nasal spray Place 2 sprays into both nostrils daily.  Marland Kitchen HYDROcodone-Homatropine (TUSSIGON) 5-1.5 MG TABS Take 1  tablet by mouth every 6 (six) hours as needed.  Marland Kitchen omeprazole (PRILOSEC) 20 MG capsule Take 1 capsule (20 mg total) by mouth daily.  . ranitidine (ZANTAC) 150 MG tablet Take 150 mg by mouth 2 (two) times daily.  . [DISCONTINUED] Azelastine-Fluticasone (DYMISTA) 137-50 MCG/ACT SUSP Place 2 sprays into both nostrils daily.   No current facility-administered medications on file prior to visit.      Allergies  Allergen Reactions  . Codeine Rash    Review Of Systems:  Constitutional:   No  weight loss, night sweats,  Fevers, chills, + fatigue, or  lassitude.  HEENT:   No headaches,  Difficulty swallowing,  Tooth/dental problems, or  Sore throat,                No sneezing, itching, ear ache, nasal congestion,+  post nasal drip,   CV:  No chest pain,  Orthopnea, PND, + chronic swelling in lower extremities, No anasarca, dizziness, palpitations, syncope.   GI  No heartburn, indigestion, abdominal pain, nausea, vomiting, diarrhea, change in bowel habits, loss of appetite, bloody stools.   Resp: No  shortness of breath with exertion or at rest.  No excess mucus, no productive cough,  No non-productive cough,  No coughing up of blood.  No change in color of mucus.  No wheezing.  No chest wall deformity  Skin: no rash or lesions.  GU: no dysuria, change in color of urine, no urgency or frequency.  No flank pain, no hematuria   Anna:  No joint pain or swelling.  No decreased range of motion.  No back pain.  Psych:  No change in mood or affect. No depression or anxiety.  No memory loss.   Vital Signs BP 120/80 (BP Location: Left Arm, Cuff Size: Large)   Pulse 84   Ht 5\' 7"  (1.702 m)   Wt 256 lb 6.4 oz (116.3 kg)   SpO2 100%   BMI 40.16 kg/m    Physical Exam:  General- No distress,  A&Ox3, pleasant ENT: No sinus tenderness, TM clear, pale nasal mucosa, no oral exudate,+ post nasal drip, no LAN Cardiac: S1, S2, regular rate and rhythm, no murmur Chest: No wheeze/ rales/ dullness; no accessory muscle use, no nasal flaring, no sternal retractions Abd.: Soft Non-tender, ND BS + Ext: No clubbing cyanosis, 1-2 + LE edema Neuro:  normal strength, MAE x 4, A&O x 3 Skin: No rashes,no lesions warm and dry Psych: normal mood and behavior   Assessment/Plan  Cough variant asthma Stable interval until 11.2019 when she had bronchitis Treated by her PCP with antibiotic and steroid injection Compliant with Symbicort Plan: Continue Chlorpheniramine at bedtime as you have been doing. Continue  Symbicort 2 puffs once daily Rinse mouth after use Continue zantac and prilosec daily for reflux control. Continue Flonase as you have been doing Be aware of your flare symptoms and seek care early on, as opposed to waiting until you get  worse Follow up with Dr. Vaughan Larson in 6 months, consider repeat PFT's at that time Please contact office for sooner follow up if symptoms do not improve or worsen or seek emergency care    Primary atypical carcinoid tumor of right lung ([place]) Stable with no  indication of recurrence 04/2018 Plan>> per Dr. Lew Dawes recommendations Follow up CT Chest 04/2019, please schedule today Follow up with Dr. Earlie Server after CT chest>> message sent to Norton Blizzard 10/24/2018     Anemia, iron deficiency Continue iron tablets as prescribed by Estill Bamberg  Lovena Le, PA Follow up labs/ monitoring per above provider as she is treating Follow up with Dr. Vaughan Larson in 6 months, consider repeat PFT's at that time Please contact office for sooner follow up if symptoms do not improve or worsen or seek emergency care      Magdalen Spatz, NP 10/24/2018  4:52 PM

## 2019-01-14 ENCOUNTER — Ambulatory Visit (INDEPENDENT_AMBULATORY_CARE_PROVIDER_SITE_OTHER): Payer: 59 | Admitting: Nurse Practitioner

## 2019-01-14 ENCOUNTER — Encounter: Payer: Self-pay | Admitting: Nurse Practitioner

## 2019-01-14 VITALS — BP 130/80 | HR 91 | Ht 68.0 in | Wt 267.0 lb

## 2019-01-14 DIAGNOSIS — J45901 Unspecified asthma with (acute) exacerbation: Secondary | ICD-10-CM

## 2019-01-14 MED ORDER — LEVALBUTEROL HCL 0.63 MG/3ML IN NEBU
0.6300 mg | INHALATION_SOLUTION | Freq: Once | RESPIRATORY_TRACT | Status: AC
  Administered 2019-01-14: 0.63 mg via RESPIRATORY_TRACT

## 2019-01-14 MED ORDER — METHYLPREDNISOLONE ACETATE 80 MG/ML IJ SUSP
80.0000 mg | Freq: Once | INTRAMUSCULAR | Status: AC
Start: 2019-01-14 — End: 2019-01-14
  Administered 2019-01-14: 80 mg via INTRAMUSCULAR

## 2019-01-14 MED ORDER — PREDNISONE 10 MG PO TABS: ORAL_TABLET | ORAL | 0 refills | Status: DC

## 2019-01-14 MED ORDER — REVEFENACIN 175 MCG/3ML IN SOLN: 1.0000 | Freq: Every day | RESPIRATORY_TRACT | 0 refills | Status: DC

## 2019-01-14 MED ORDER — FORMOTEROL FUMARATE 20 MCG/2ML IN NEBU: 20.0000 ug | INHALATION_SOLUTION | Freq: Two times a day (BID) | RESPIRATORY_TRACT | 0 refills | Status: DC

## 2019-01-14 MED ORDER — AZITHROMYCIN 250 MG PO TABS: ORAL_TABLET | ORAL | 0 refills | Status: DC

## 2019-01-14 MED ORDER — FLUCONAZOLE 150 MG PO TABS: 150.0000 mg | ORAL_TABLET | Freq: Every day | ORAL | 0 refills | Status: AC

## 2019-01-14 NOTE — Progress Notes (Signed)
@Patient  ID: Anna Larson, female    DOB: [birth year], 53 y.o.   MRN: [id number]  Chief Complaint  Patient presents with  . Follow-up    chest tightness start yesterday    Referring provider: Windell Hummingbird, PA-C  HPI Anna Larson is a 53 y.o. female former smoker with known Mild, Intermittent Asthma,GERD andnon-small cell lung cancer status post resection 10/2015.She is a former [name]. She has been re-assigned to [name].  TestS: Fe 04/10/2018>> 19 ug/dL   PFT 08/24/16: FVC 3.04 L (77%) FEV1 2.43 L (78%) FEV1/FVC 0.80 FEF 25-75 2.29 L (77%) negative bronchodilator response 01/20/16: FVC 3.16 L (80%) FEV1 2.53 L (81%) FEV1/FVC 0.80 FEF 25-75 2.40 L (80%) negative bronchodilator response TLC 4.66 L (84%) RV 80% ERV 27% DLCO corrected 84% 08/26/15: FVC 3.09 L (78%) FEV1 2.07 L (66%) FEV1/FVC 0.67 FEF 25-75 1.86 L (62%) negative bronchodilator response TLC 4.26 L (77%) RV 71% ERV 15% DLCO uncorrected 69% 04/04/13: FVC 3.20 L (80%) FEV1 2.50 L (77%) FEV1/FVC 0.78 FEF 25-75 2.46 L (75%)  IMAGING CT Chest 04/2018 New mild subpleural opacity in the right upper lobe is predominately linear and likely inflammatory or scarring. There is no consolidation or suspicious pulmonary nodule. No evidence of local recurrence or metastatic disease post partial right upper lobe resection. No adenopathy.   MAXILLOFACIAL CT LTD W/O 12/27/16 (per radiologist): Clear sinuses. No areas of bone resorption or sclerosis. Globes and orbits are unremarkable. No soft tissue masses.  CXR PA/LAT 12/14/16 :Questionable right lung hazy opacification. Radiology reported no focal opacity on their imaging monitor. No dense consolidation.No focal mass. No pleural effusion. Heart normal in size & mediastinum normal in contour.  CT CHEST W/ 04/26/16 (per radiologist): No acute findings identified. No complications status post partial right upper lobectomy. Nonspecific soft tissue and thickening  along the right lung suture line is identified as above. This likely reflects postoperative change.  PET CT 08/10/15:Right upper lobe nodule with max SUV 3.8. No pathologic mediastinal or hilar adenopathy. Left-sided axillary lymph node with max SUV 2.7. Multiple small inguinal lymph nodes. Diffuse increased metabolic activity in the bony structures most notably the spine and pelvis as well as muscular activity.  PATHOLOGY RUL RESECTION & LYMPH NODE DISSECTION (10/12/15): Atypical carcinoid tumor 1.4 cm in greatest dimension. 10R lymph node with metastasis. Level 7 & 12R lymph nodes negative.  LABS 09/08/15 ANA: Negative Anti-CCP: <16 SSA: <1.0 SSB: <1.0  08/26/15 Alpha-1 antitrypsin: 173  09/23/13 IgE: 2.1 RAST Panel: Negative Non-allergy related symptoms  OV 01/14/19 - acute chest tightness and cough  Patient presents today for an acute visit.  She states that for the past 2 days she has been having chest tightness with cough.  Denies any recent fever.  States that her cough is nonproductive.  She has had bronchitis twice in the past year and feels like it is starting again.  She wanted to go ahead and come in and be seen before symptoms worsened.  She states that she has been compliant with her Symbicort.  She also uses Astelin nasal spray daily.  She denies any significant shortness of breath, chest pain, or edema.  She has noticed some wheezing over the past couple days.  Patient's vital signs in the office today are stable.  Patient's O2 sats are at 98% on room air.   Allergies  Allergen Reactions  . Codeine Rash    Immunization History  Administered Date(s) Administered  . Influenza Inj Mdck  Quad Pf 10/15/2018  . Influenza Split 09/07/2012  . Influenza, High Dose Seasonal PF 07/11/2013  . Influenza,inj,Quad PF,6+ Mos 07/29/2014, 07/30/2015, 08/24/2016  . Pneumococcal Conjugate-13 10/20/2015  . Pneumococcal Polysaccharide-23 09/23/2013    Past Medical History:   Diagnosis Date  . Allergic rhinitis   . Anemia    Takes Ferrous sulfate  . Arthritis   . Asthma   . COPD (chronic obstructive pulmonary disease) ([place])   . Cough   . GERD (gastroesophageal reflux disease)   . History of kidney stones    x 3 episodes  . Hx of adenomatous + sessile serrated colonic polyps 09/22/2015  . Lung nodule < 6cm on CT   . Pneumonia    hx of  . 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 azelastine (ASTELIN) 0.1 % nasal spray Place 2 sprays into both nostrils 2 (two) times daily.  . budesonide-formoterol (SYMBICORT) 160-4.5 MCG/ACT inhaler Inhale 2 puffs into the lungs 2 (two) times daily.  . chlorpheniramine (CHLOR-TRIMETON) 4 MG tablet Take 3 tablets (12 mg total) by mouth at bedtime.  . fluticasone (FLONASE) 50 MCG/ACT nasal spray Place 2 sprays into both nostrils daily.  Marland Kitchen HYDROcodone-Homatropine (TUSSIGON) 5-1.5 MG TABS Take 1 tablet by mouth every 6 (six) hours as needed.  Marland Kitchen omeprazole (PRILOSEC) 20 MG capsule Take 1 capsule (20 mg total) by mouth daily.  . ranitidine (ZANTAC) 150 MG tablet Take 150 mg by mouth 2 (two) times daily.  Marland Kitchen azithromycin (ZITHROMAX) 250 MG tablet Take 2 tablets (500 mg) on day 1, then take 1 tablet (250 mg) on days 2-5  . fluconazole (DIFLUCAN) 150 MG tablet Take 1 tablet (150 mg total) by mouth daily for 1 day.  . predniSONE (DELTASONE) 10 MG tablet Take 4 tabs for 2 days, then 3 tabs for 2 days, then 2 tabs for 2 days, then 1 tab for 2 days, then stop  . [DISCONTINUED] Azelastine-Fluticasone (DYMISTA) 137-50 MCG/ACT  SUSP Place 2 sprays into both nostrils daily.  . [DISCONTINUED] formoterol (PERFOROMIST) 20 MCG/2ML nebulizer solution Take 2 mLs (20 mcg total) by nebulization 2 (two) times daily.  . [DISCONTINUED] Revefenacin (YUPELRI) 175 MCG/3ML SOLN Inhale 1 vial into the lungs daily.  . [EXPIRED] levalbuterol (XOPENEX) nebulizer solution 0.63 mg   . [EXPIRED] methylPREDNISolone acetate (DEPO-MEDROL) injection 80 mg    No facility-administered encounter medications on file as of 01/14/2019.      Review of Systems  Review of Systems  Constitutional: Negative.  Negative for chills and fever.  HENT: Negative.   Respiratory: Positive for cough, chest tightness and wheezing. Negative for shortness of breath.   Cardiovascular: Negative.  Negative for chest pain, palpitations and leg swelling.  Gastrointestinal: Negative.   Allergic/Immunologic: Negative.   Neurological: Negative.   Psychiatric/Behavioral: Negative.        Physical Exam  BP 130/80 (BP Location: Right Arm, Patient Position: Sitting, Cuff Size: Normal)   Pulse 91   Ht 5\' 8"  (1.727 m)   Wt 267 lb (121.1 kg)   SpO2 98%   BMI  40.60 kg/m   Wt Readings from Last 5 Encounters:  01/14/19 267 lb (121.1 kg)  10/24/18 256 lb 6.4 oz (116.3 kg)  06/01/18 262 lb (118.8 kg)  04/11/18 263 lb 9.6 oz (119.6 kg)  12/29/16 272 lb (123.4 kg)     Physical Exam Vitals signs and nursing note reviewed.  Constitutional:      General: She is not in acute distress.    Appearance: She is well-developed.  Cardiovascular:     Rate and Rhythm: Normal rate and regular rhythm.  Pulmonary:     Effort: Pulmonary effort is normal. No respiratory distress.     Breath sounds: Normal breath sounds. No wheezing or rhonchi.  Musculoskeletal:        General: No swelling.  Neurological:     Mental Status: She is alert and oriented to person, place, and time.        Assessment & Plan:   Mild asthma with exacerbation Presents today with chest  tightness and cough.  She states that symptoms started a couple days ago and is very similar to the last time she had bronchitis.  Will treat today for asthma exacerbation with possible bronchitis.  We will set her up a return visit with [name].  She was supposed to establish care with him after Dr. Ashok Cordia, but has yet to be able to see him.  Patient Instructions  Depo medrol given in office today Xopenex neb given in office today Will order prednisone taper to start tomorrow Will order azithromycin Continue Symbicort daily  Follow up: Please follow up with [name] in 3 months or sooner if needed        Fenton Foy, NP 01/14/2019

## 2019-01-14 NOTE — Assessment & Plan Note (Addendum)
Presents today with chest tightness and cough.  She states that symptoms started a couple days ago and is very similar to the last time she had bronchitis.  Will treat today for asthma exacerbation with possible bronchitis.  We will set her up a return visit with [name].  She was supposed to establish care with him after Dr. Ashok Cordia, but has yet to be able to see him.  Patient Instructions  Depo medrol given in office today Xopenex neb given in office today Will order prednisone taper to start tomorrow Will order azithromycin Continue Symbicort daily  Follow up: Please follow up with [name] in 3 months or sooner if needed

## 2019-01-14 NOTE — Patient Instructions (Signed)
Depo medrol given in office today Xopenex neb given in office today Will order prednisone taper to start tomorrow Will order azithromycin Continue Symbicort daily  Follow up: Please follow up with [name] in 3 months or sooner if needed

## 2019-01-21 ENCOUNTER — Telehealth: Payer: Self-pay | Admitting: Nurse Practitioner

## 2019-01-21 MED ORDER — PREDNISONE 10 MG PO TABS: ORAL_TABLET | ORAL | 0 refills | Status: DC

## 2019-01-21 MED ORDER — MOXIFLOXACIN HCL 400 MG PO TABS: 400.0000 mg | ORAL_TABLET | Freq: Every day | ORAL | 0 refills | Status: DC

## 2019-01-21 NOTE — Telephone Encounter (Signed)
Primary Pulmonologist: Former Barista, Will establish with [name] later this year Last office visit and with whom: 01/14/19 What do we see them for (pulmonary problems): COPD Last OV assessment/plan:   Depo medrol given in office today Xopenex neb given in office today Will order prednisone taper to start tomorrow Will order azithromycin Continue Symbicort daily  Follow up: Please follow up with [name] in 3 months or sooner if needed  Was appointment offered to patient (explain)?  No, not offering sick appts to patients at this time.    Reason for call: Spoke with patient. She stated that she was seen last Monday 01/11/19 by Kenney Houseman. She was prescribed a zpak and prednisone taper. She stated that she is finished with both of these and is still having issues with both nasal and chest congestion. Denies any recent travel. Increased SOB due to the congestion. Denies any fevers or chills.   She wants to know if she could have another round of abx and prednisone since it normally takes 2 rounds.   Pharmacy is [facility] in [place].   Tonya, please advise since you were the last provider to see her. Thanks!

## 2019-01-21 NOTE — Telephone Encounter (Signed)
Spoke with patient. She is aware that Kenney Houseman has called in Avelox and an extension on her prednisone. She is aware of instructions.   Nothing further needed at time of call.

## 2019-01-21 NOTE — Telephone Encounter (Signed)
I sent in another antibiotic and some more prednisone. She can also take mucinex.

## 2019-01-24 ENCOUNTER — Encounter: Payer: Self-pay | Admitting: *Deleted

## 2019-01-24 ENCOUNTER — Telehealth: Payer: Self-pay | Admitting: Nurse Practitioner

## 2019-01-24 NOTE — Telephone Encounter (Signed)
Letter done, signed and placed up front for pick up per pt request  She will come pick up  Nothing further needed

## 2019-01-24 NOTE — Telephone Encounter (Signed)
Yes. Please make note for patient. She is recovering from COPD exacerbation.

## 2019-01-24 NOTE — Telephone Encounter (Signed)
Called and spoke with patient, she stated that she has chosen to stay home from work from the date of 01/23/19 until 02/04/19 due to the virus. Patient has requested that we write her a work note for this.  TN please advise, thank you.

## 2019-01-24 NOTE — Telephone Encounter (Signed)
Advised patient we are waiting on response.   TN please advise, thank you.

## 2019-01-24 NOTE — Telephone Encounter (Signed)
Pt called checking on note. Wants to know if it is ready or if we need more information. CB# [phone]

## 2019-01-25 ENCOUNTER — Encounter: Payer: Self-pay | Admitting: Nurse Practitioner

## 2019-01-25 ENCOUNTER — Ambulatory Visit (INDEPENDENT_AMBULATORY_CARE_PROVIDER_SITE_OTHER): Payer: 59 | Admitting: Nurse Practitioner

## 2019-01-25 ENCOUNTER — Telehealth: Payer: Self-pay | Admitting: Nurse Practitioner

## 2019-01-25 ENCOUNTER — Other Ambulatory Visit: Payer: Self-pay

## 2019-01-25 VITALS — BP 128/86 | HR 90 | Temp 98.2°F | Ht 68.0 in | Wt 267.0 lb

## 2019-01-25 DIAGNOSIS — J029 Acute pharyngitis, unspecified: Secondary | ICD-10-CM | POA: Diagnosis not present

## 2019-01-25 DIAGNOSIS — J020 Streptococcal pharyngitis: Secondary | ICD-10-CM | POA: Diagnosis not present

## 2019-01-25 LAB — STREP COMPLETE PANEL
Strep Pyogenes: NEGATIVE
Strep dysgalactiae: POSITIVE — AB

## 2019-01-25 MED ORDER — FIRST-DUKES MOUTHWASH MT SUSP: 5.0000 mL | Freq: Three times a day (TID) | OROMUCOSAL | 0 refills | Status: DC

## 2019-01-25 MED ORDER — AMOXICILLIN-POT CLAVULANATE 875-125 MG PO TABS: 1.0000 | ORAL_TABLET | Freq: Two times a day (BID) | ORAL | 0 refills | Status: DC

## 2019-01-25 NOTE — Telephone Encounter (Signed)
Primary Pulmonologist: Ashok Cordia Last office visit and with whom: 01/14/2019 What do we see them for (pulmonary problems): mild asthma Last OV assessment/plan:  Assessment & Plan:   Mild asthma with exacerbation Presents today with chest tightness and cough.  She states that symptoms started a couple days ago and is very similar to the last time she had bronchitis.  Will treat today for asthma exacerbation with possible bronchitis.  We will set her up a return visit with [name].  She was supposed to establish care with him after Dr. Ashok Cordia, but has yet to be able to see him.  Patient Instructions  Depo medrol given in office today Xopenex neb given in office today Will order prednisone taper to start tomorrow Will order azithromycin Continue Symbicort daily  Follow up: Please follow up with [name] in 3 months or sooner if needed     Was appointment offered to patient (explain)? Pt would like to come in and be evaluated  Reason for call: Pt c/o sore throat for the past 2 days. She states still having chest tightness since the last visit here. She is still on pred and avelox that was given when she called 01/21/2019. She feels not improving and wants to have someone look at her throat She denies any fever, chills, sweats. No recent travel or sick contacts. She has been isolating herself. Please advise if we can schedule her an appt. Thanks!  Allergies  Allergen Reactions  . Codeine Rash

## 2019-01-25 NOTE — Assessment & Plan Note (Addendum)
We will swab the patient today for strep.  Patient Instructions  Will check strep swab - strep was positive Stop Avelox Will order augmentin Continue prednsone Continue symbicort  Follow up with Mannam in 6 months or sooner if needed

## 2019-01-25 NOTE — Patient Instructions (Addendum)
Will check strep swab - strep was positive Stop Avelox Will order augmentin Continue prednsone Continue symbicort  Follow up with Mannam in 6 months or sooner if needed

## 2019-01-25 NOTE — Telephone Encounter (Signed)
Received call report from Santiago Glad with  Lab on patient's strep test done on today 01/25/2019. TN please review the result/impression copied below:  Please advise, thank you.

## 2019-01-25 NOTE — Telephone Encounter (Signed)
Called patient. She will come in for sick visit this morning. Already scheduled at 10:30.

## 2019-01-25 NOTE — Progress Notes (Signed)
@Patient  ID: Anna Larson, female    DOB: 1966-04-09, 53 y.o.   MRN: [id number]  Chief Complaint  Patient presents with   Acute Visit    sore throat     Referring provider: Windell Hummingbird, PA-C  HPI Anna Sawa Swiftis a 53 y.o.femaleformer smokerwithknown Mild, Intermittent Asthma,GERD andnon-small cell lung cancer status post resection 10/2015.She is a former [name]. She has been re-assigned to [name].  TestS: Fe 04/10/2018>> 19 ug/dL   PFT 08/24/16: FVC 3.04 L (77%) FEV1 2.43 L (78%) FEV1/FVC 0.80 FEF 25-75 2.29 L (77%) negative bronchodilator response 01/20/16: FVC 3.16 L (80%) FEV1 2.53 L (81%) FEV1/FVC 0.80 FEF 25-75 2.40 L (80%) negative bronchodilator response TLC 4.66 L (84%) RV 80% ERV 27% DLCO corrected 84% 08/26/15: FVC 3.09 L (78%) FEV1 2.07 L (66%) FEV1/FVC 0.67 FEF 25-75 1.86 L (62%) negative bronchodilator response TLC 4.26 L (77%) RV 71% ERV 15% DLCO uncorrected 69% 04/04/13: FVC 3.20 L (80%) FEV1 2.50 L (77%) FEV1/FVC 0.78 FEF 25-75 2.46 L (75%)  IMAGING CT Chest 04/2018 New mild subpleural opacity in the right upper lobe is predominately linear and likely inflammatory or scarring. There is no consolidation or suspicious pulmonary nodule. No evidence of local recurrence or metastatic disease post partial right upper lobe resection. No adenopathy.   MAXILLOFACIAL CT LTD W/O 12/27/16 (per radiologist): Clear sinuses. No areas of bone resorption or sclerosis. Globes and orbits are unremarkable. No soft tissue masses.  CXR PA/LAT 12/14/16 :Questionable right lung hazy opacification. Radiology reported no focal opacity on their imaging monitor. No dense consolidation.No focal mass. No pleural effusion. Heart normal in size & mediastinum normal in contour.  CT CHEST W/ 04/26/16 (per radiologist): No acute findings identified. No complications status post partial right upper lobectomy. Nonspecific soft tissue and thickening along the right  lung suture line is identified as above. This likely reflects postoperative change.  PET CT 08/10/15:Right upper lobe nodule with max SUV 3.8. No pathologic mediastinal or hilar adenopathy. Left-sided axillary lymph node with max SUV 2.7. Multiple small inguinal lymph nodes. Diffuse increased metabolic activity in the bony structures most notably the spine and pelvis as well as muscular activity.  PATHOLOGY RUL RESECTION & LYMPH NODE DISSECTION (10/12/15): Atypical carcinoid tumor 1.4 cm in greatest dimension. 10R lymph node with metastasis. Level 7 & 12R lymph nodes negative.  LABS 09/08/15 ANA: Negative Anti-CCP: <16 SSA: <1.0 SSB: <1.0  08/26/15 Alpha-1 antitrypsin: 173  09/23/13 IgE: 2.1 RAST Panel: Negative Non-allergy related symptoms  OV 01/25/19 - Acute sore throat Patient presents today with ongoing cough and sore throat.  She was last seen by me on 01/14/2019. She was treated with azithromycin and prednisone.  She called back on 01/21/2019 and stated that she was not feeling better and Avelox was prescribed.  She returns today stating that she is improved but now has severe sore throat.  Denies any recent fever.  Denies any sinus congestion.  She is compliant with Symbicort.  Denies f/c/s, n/v/d, hemoptysis, PND, leg swelling.     Allergies  Allergen Reactions   Codeine Rash    Immunization History  Administered Date(s) Administered   Influenza Inj Mdck Quad Pf 10/15/2018   Influenza Split 09/07/2012   Influenza, High Dose Seasonal PF 07/11/2013   Influenza,inj,Quad PF,6+ Mos 07/29/2014, 07/30/2015, 08/24/2016   Pneumococcal Conjugate-13 10/20/2015   Pneumococcal Polysaccharide-23 09/23/2013    Past Medical History:  Diagnosis Date   Allergic rhinitis    Anemia  Takes Ferrous sulfate   Arthritis    Asthma    COPD (chronic obstructive pulmonary disease) (HCC)    Cough    GERD (gastroesophageal reflux disease)    History of kidney  stones    x 3 episodes   Hx of adenomatous + sessile serrated colonic polyps 09/22/2015   Lung nodule < 6cm on CT    Pneumonia    hx of   Primary atypical carcinoid tumor of right lung (HCC)    Right upper lobe segmentectomy 2016- T1N1    Tobacco History: Social History   Tobacco Use  Smoking Status Former Smoker  Smokeless Tobacco Never Used  Tobacco Comment   both mothers smoked: States she smoked as a teen, very little   Counseling given: Not Answered Comment: both mothers smoked: States she smoked as a teen, very little   Outpatient Encounter Medications as of 01/25/2019  Medication Sig   azelastine (ASTELIN) 0.1 % nasal spray Place 2 sprays into both nostrils 2 (two) times daily.   azithromycin (ZITHROMAX) 250 MG tablet Take 2 tablets (500 mg) on day 1, then take 1 tablet (250 mg) on days 2-5   budesonide-formoterol (SYMBICORT) 160-4.5 MCG/ACT inhaler Inhale 2 puffs into the lungs 2 (two) times daily.   chlorpheniramine (CHLOR-TRIMETON) 4 MG tablet Take 3 tablets (12 mg total) by mouth at bedtime.   fluticasone (FLONASE) 50 MCG/ACT nasal spray Place 2 sprays into both nostrils daily.   HYDROcodone-Homatropine (TUSSIGON) 5-1.5 MG TABS Take 1 tablet by mouth every 6 (six) hours as needed.   moxifloxacin (AVELOX) 400 MG tablet Take 1 tablet (400 mg total) by mouth daily.   omeprazole (PRILOSEC) 20 MG capsule Take 1 capsule (20 mg total) by mouth daily.   predniSONE (DELTASONE) 10 MG tablet Take 4 tabs for 2 days, then 3 tabs for 2 days, then 2 tabs for 2 days, then 1 tab for 2 days, then stop   ranitidine (ZANTAC) 150 MG tablet Take 150 mg by mouth 2 (two) times daily.   amoxicillin-clavulanate (AUGMENTIN) 875-125 MG tablet Take 1 tablet by mouth 2 (two) times daily.   Diphenhyd-Hydrocort-Nystatin (FIRST-DUKES MOUTHWASH) SUSP Use as directed 5 mLs in the mouth or throat 4 (four) times daily - after meals and at bedtime.   [DISCONTINUED] Azelastine-Fluticasone  (DYMISTA) 137-50 MCG/ACT SUSP Place 2 sprays into both nostrils daily.   No facility-administered encounter medications on file as of 01/25/2019.      Review of Systems  Review of Systems  Constitutional: Negative.  Negative for chills and fever.  HENT: Positive for sore throat.   Respiratory: Positive for cough. Negative for shortness of breath and wheezing.   Cardiovascular: Negative.  Negative for chest pain, palpitations and leg swelling.  Gastrointestinal: Negative.   Allergic/Immunologic: Negative.   Neurological: Negative.   Psychiatric/Behavioral: Negative.        Physical Exam  BP 128/86 (BP Location: Left Arm, Patient Position: Sitting, Cuff Size: Normal)    Pulse 90    Temp 98.2 F (36.8 C)    Ht 5\' 8"  (1.727 m)    Wt 267 lb (121.1 kg)    BMI 40.60 kg/m   Wt Readings from Last 5 Encounters:  01/25/19 267 lb (121.1 kg)  01/14/19 267 lb (121.1 kg)  10/24/18 256 lb 6.4 oz (116.3 kg)  06/01/18 262 lb (118.8 kg)  04/11/18 263 lb 9.6 oz (119.6 kg)     Physical Exam Vitals signs and nursing note reviewed.  Constitutional:  General: She is not in acute distress.    Appearance: She is well-developed.  Cardiovascular:     Rate and Rhythm: Normal rate and regular rhythm.  Pulmonary:     Effort: Pulmonary effort is normal. No respiratory distress.     Breath sounds: Normal breath sounds. No wheezing or rhonchi.  Musculoskeletal:        General: No swelling.  Neurological:     Mental Status: She is alert and oriented to person, place, and time.       Assessment & Plan:   Pharyngitis We will swab the patient today for strep.  Patient Instructions  Will check strep swab - strep was positive Stop Avelox Will order augmentin Continue prednsone Continue symbicort  Follow up with Mannam in 6 months or sooner if needed        Fenton Foy, NP 01/25/2019

## 2019-01-30 ENCOUNTER — Telehealth: Payer: Self-pay | Admitting: Nurse Practitioner

## 2019-01-30 NOTE — Telephone Encounter (Signed)
Spoke with pt. She is aware of Tonya's response. Pt was very unhappy. Nothing further was needed.

## 2019-01-30 NOTE — Telephone Encounter (Signed)
Unfortunately, we can not write her out of work until 03/22/19. Please use all precautions when returning to work. Thanks.

## 2019-01-30 NOTE — Telephone Encounter (Signed)
Called and spoke with patient, she has currently been written out of work through 02/04/19. 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 Patient is requesting to have a note that writes her out until 03/22/19 so that she does not risk getting infected or sick.    TN please advise, thank you.

## 2019-02-21 NOTE — Telephone Encounter (Signed)
See telephone note dated 01/30/19 patient was given work note based on results.  Nothing further needed at this time.

## 2019-04-08 ENCOUNTER — Encounter: Payer: Self-pay | Admitting: *Deleted

## 2019-04-08 NOTE — Progress Notes (Signed)
Oncology Nurse Navigator Documentation  Oncology Nurse Navigator Flowsheets 04/08/2019  Navigator Location [facility]  Navigator Encounter Type Other/I updated Dr. Julien Nordmann on CT without contrast on 04/29/2019.  He is ok with ct scan and would like to see her a few days after the scan.  I completed a scheduling message.    Patient Visit Type -  Treatment Phase Follow-up  Barriers/Navigation Needs Coordination of Care  Interventions Coordination of Care  Coordination of Care Other  Acuity Level 2  Time Spent with Patient 30

## 2019-04-26 ENCOUNTER — Ambulatory Visit: Payer: 59 | Admitting: Pulmonary Disease

## 2019-04-26 ENCOUNTER — Telehealth: Payer: Self-pay | Admitting: *Deleted

## 2019-04-26 ENCOUNTER — Other Ambulatory Visit: Payer: Self-pay | Admitting: Medical Oncology

## 2019-04-26 DIAGNOSIS — C7A090 Malignant carcinoid tumor of the bronchus and lung: Secondary | ICD-10-CM

## 2019-04-26 NOTE — Telephone Encounter (Signed)

## 2019-04-29 ENCOUNTER — Other Ambulatory Visit: Payer: Self-pay

## 2019-04-29 ENCOUNTER — Ambulatory Visit (INDEPENDENT_AMBULATORY_CARE_PROVIDER_SITE_OTHER)
Admission: RE | Admit: 2019-04-29 | Discharge: 2019-04-29 | Disposition: A | Discharge status: Home / Self Care | Payer: 59 | Source: Ambulatory Visit | Attending: Acute Care | Admitting: Acute Care

## 2019-04-29 ENCOUNTER — Inpatient Hospital Stay: Payer: 59 | Attending: Internal Medicine

## 2019-04-29 DIAGNOSIS — C7A090 Malignant carcinoid tumor of the bronchus and lung: Secondary | ICD-10-CM | POA: Diagnosis not present

## 2019-04-29 DIAGNOSIS — N951 Menopausal and female climacteric states: Secondary | ICD-10-CM | POA: Diagnosis not present

## 2019-04-29 DIAGNOSIS — R05 Cough: Secondary | ICD-10-CM | POA: Diagnosis not present

## 2019-04-29 DIAGNOSIS — R059 Cough, unspecified: Secondary | ICD-10-CM

## 2019-04-29 LAB — CMP (CANCER CENTER ONLY)
ALT: 15 U/L (ref 0–44)
AST: 13 U/L — ABNORMAL LOW (ref 15–41)
Albumin: 4 g/dL (ref 3.5–5.0)
Alkaline Phosphatase: 106 U/L (ref 38–126)
Anion gap: 7 (ref 5–15)
BUN: 7 mg/dL (ref 6–20)
CO2: 26 mmol/L (ref 22–32)
Calcium: 8.4 mg/dL — ABNORMAL LOW (ref 8.9–10.3)
Chloride: 107 mmol/L (ref 98–111)
Creatinine: 0.75 mg/dL (ref 0.44–1.00)
GFR, Est AFR Am: >60 mL/min (ref >60)
GFR, Estimated: >60 mL/min (ref >60)
Glucose, Bld: 95 mg/dL (ref 70–99)
Potassium: 3.8 mmol/L (ref 3.5–5.1)
Sodium: 140 mmol/L (ref 135–145)
Total Bilirubin: 0.3 mg/dL (ref 0.3–1.2)
Total Protein: 6.7 g/dL (ref 6.5–8.1)

## 2019-04-29 LAB — CBC WITH DIFFERENTIAL (CANCER CENTER ONLY)
Abs Immature Granulocytes: 0.01 10*3/uL (ref 0.00–0.07)
Basophils Absolute: 0 10*3/uL (ref 0.0–0.1)
Basophils Relative: 1 %
Eosinophils Absolute: 0.1 10*3/uL (ref 0.0–0.5)
Eosinophils Relative: 1 %
HCT: 44.7 % (ref 36.0–46.0)
Hemoglobin: 13.8 g/dL (ref 12.0–15.0)
Immature Granulocytes: 0 %
Lymphocytes Relative: 37 %
Lymphs Abs: 2 10*3/uL (ref 0.7–4.0)
MCH: 25.5 pg — ABNORMAL LOW (ref 26.0–34.0)
MCHC: 30.9 g/dL (ref 30.0–36.0)
MCV: 82.5 fL (ref 80.0–100.0)
Monocytes Absolute: 0.5 10*3/uL (ref 0.1–1.0)
Monocytes Relative: 9 %
Neutro Abs: 2.8 10*3/uL (ref 1.7–7.7)
Neutrophils Relative %: 52 %
Platelet Count: 186 10*3/uL (ref 150–400)
RBC: 5.42 MIL/uL — ABNORMAL HIGH (ref 3.87–5.11)
RDW: 15.4 % (ref 11.5–15.5)
WBC Count: 5.3 10*3/uL (ref 4.0–10.5)
nRBC: 0 % (ref 0.0–0.2)

## 2019-05-02 ENCOUNTER — Inpatient Hospital Stay (HOSPITAL_BASED_OUTPATIENT_CLINIC_OR_DEPARTMENT_OTHER): Payer: 59 | Admitting: Internal Medicine

## 2019-05-02 ENCOUNTER — Other Ambulatory Visit: Payer: Self-pay

## 2019-05-02 ENCOUNTER — Encounter: Payer: Self-pay | Admitting: Internal Medicine

## 2019-05-02 VITALS — BP 123/80 | HR 82 | Temp 98.0°F | Resp 18 | Ht 68.0 in | Wt 268.9 lb

## 2019-05-02 DIAGNOSIS — N951 Menopausal and female climacteric states: Secondary | ICD-10-CM | POA: Diagnosis not present

## 2019-05-02 DIAGNOSIS — C3490 Malignant neoplasm of unspecified part of unspecified bronchus or lung: Secondary | ICD-10-CM

## 2019-05-02 DIAGNOSIS — C7A090 Malignant carcinoid tumor of the bronchus and lung: Secondary | ICD-10-CM | POA: Diagnosis not present

## 2019-05-02 NOTE — Addendum Note (Signed)
Addended by: Ardeen Garland on: 05/02/2019 10:47 AM   Modules accepted: Orders

## 2019-05-02 NOTE — Progress Notes (Signed)
[facility] [phone]   [phone]  OFFICE PROGRESS NOTE  Windell Hummingbird, PA-C [address] 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 Roxan Hockey.  CURRENT THERAPY: Observation.  INTERVAL HISTORY: Anna Larson 53 y.o. female returns to the clinic today for follow-up visit.  The patient was last seen more than 2 years ago.  She was seen by the pulmonary medicine last year.  She is feeling fine with no concerning complaints today except for occasional hot flashes.  She denied having any weight loss.  She denied having any chest pain, shortness of breath, cough or hemoptysis.  She denied having any nausea, vomiting, diarrhea or constipation.  She has no headache or visual changes.  She is here today for evaluation with repeat CT scan of the chest for restaging of her disease.  MEDICAL HISTORY: Past Medical History:  Diagnosis Date  . Allergic rhinitis   . Anemia    Takes Ferrous sulfate  . Arthritis   . Asthma   . COPD (chronic obstructive pulmonary disease) ([facility])   . Cough   . GERD (gastroesophageal reflux disease)   . History of kidney stones    x 3 episodes  . Hx of adenomatous + sessile serrated colonic polyps 09/22/2015  . Lung nodule < 6cm on CT   . Pneumonia    hx of  . Primary atypical carcinoid tumor of right lung ([facility])    Right upper lobe segmentectomy 2016- T1N1    ALLERGIES:  is allergic to codeine.  MEDICATIONS:  Current Outpatient Medications  Medication Sig Dispense Refill  . amoxicillin-clavulanate (AUGMENTIN) 875-125 MG tablet Take 1 tablet by mouth 2 (two) times daily. 14 tablet 0  . azelastine (ASTELIN) 0.1 % nasal spray Place 2 sprays into both nostrils 2 (two) times daily. 30 mL 5  .  azithromycin (ZITHROMAX) 250 MG tablet Take 2 tablets (500 mg) on day 1, then take 1 tablet (250 mg) on days 2-5 6 tablet 0  . budesonide-formoterol (SYMBICORT) 160-4.5 MCG/ACT inhaler Inhale 2 puffs into the lungs 2 (two) times daily. 1 Inhaler 0  . chlorpheniramine (CHLOR-TRIMETON) 4 MG tablet Take 3 tablets (12 mg total) by mouth at bedtime. 90 tablet 2  . Diphenhyd-Hydrocort-Nystatin (FIRST-DUKES MOUTHWASH) SUSP Use as directed 5 mLs in the mouth or throat 4 (four) times daily - after meals and at bedtime. 1 Bottle 0  . fluticasone (FLONASE) 50 MCG/ACT nasal spray Place 2 sprays into both nostrils daily. 16 g 5  . HYDROcodone-Homatropine (TUSSIGON) 5-1.5 MG TABS Take 1 tablet by mouth every 6 (six) hours as needed. 30 each 0  . moxifloxacin (AVELOX) 400 MG tablet Take 1 tablet (400 mg total) by mouth daily. 7 tablet 0  . omeprazole (PRILOSEC) 20 MG capsule Take 1 capsule (20 mg total) by mouth daily. 30 capsule 0  . predniSONE (DELTASONE) 10 MG tablet Take 4 tabs for 2 days, then 3 tabs for 2 days, then 2 tabs for 2 days, then 1 tab for 2 days, then stop 20 tablet 0  . ranitidine (ZANTAC) 150 MG tablet Take 150 mg by mouth 2 (two) times daily.     No current facility-administered medications for this visit.     SURGICAL HISTORY:  Past Surgical History:  Procedure Laterality Date  . CESAREAN SECTION    . COLONOSCOPY WITH PROPOFOL N/A 09/22/2015   Procedure: COLONOSCOPY WITH PROPOFOL;  Surgeon: Gatha Mayer, MD;  Location: [facility];  Service: Endoscopy;  Laterality: N/A;  . ESOPHAGOGASTRODUODENOSCOPY (EGD) WITH PROPOFOL N/A 09/22/2015   Procedure: ESOPHAGOGASTRODUODENOSCOPY (EGD) WITH PROPOFOL;  Surgeon: Gatha Mayer, MD;  Location: [facility];  Service: Endoscopy;  Laterality: N/A;  . LITHOTRIPSY     x2  . SEGMENTECOMY Right 10/12/2015   Procedure: RIGHT UPPER LOBE SEGMENTECTOMY;  Surgeon: [name], MD;  Location: [facility];  Service: Thoracic;  Laterality: Right;  .  TUBAL LIGATION    . VIDEO ASSISTED THORACOSCOPY Right 10/12/2015   Procedure: VIDEO ASSISTED THORACOSCOPY;  Surgeon: [name], MD;  Location: [facility];  Service: Thoracic;  Laterality: Right;    REVIEW OF SYSTEMS:  A comprehensive review of systems was negative.   PHYSICAL EXAMINATION: General appearance: alert, cooperative and no distress Head: Normocephalic, without obvious abnormality, atraumatic Neck: no adenopathy, no JVD, supple, symmetrical, trachea midline and thyroid not enlarged, symmetric, no tenderness/mass/nodules Lymph nodes: Cervical, supraclavicular, and axillary nodes normal. Resp: clear to auscultation bilaterally Back: symmetric, no curvature. ROM normal. No CVA tenderness. Cardio: regular rate and rhythm, S1, S2 normal, no murmur, click, rub or gallop GI: soft, non-tender; bowel sounds normal; no masses,  no organomegaly Extremities: extremities normal, atraumatic, no cyanosis or edema  ECOG PERFORMANCE STATUS: 1 - Symptomatic but completely ambulatory  Blood pressure 123/80, pulse 82, temperature 98 F (36.7 C), temperature source Oral, resp. rate 18, height 5\' 8"  (1.727 m), weight 268 lb 14.4 oz (122 kg), SpO2 98 %.  LABORATORY DATA: Lab Results  Component Value Date   WBC 5.3 04/29/2019   HGB 13.8 04/29/2019   HCT 44.7 04/29/2019   MCV 82.5 04/29/2019   PLT 186 04/29/2019      Chemistry      Component Value Date/Time   NA 140 04/29/2019 1446   NA 141 11/11/2016 1451   K 3.8 04/29/2019 1446   K 4.2 11/11/2016 1451   CL 107 04/29/2019 1446   CO2 26 04/29/2019 1446   CO2 26 11/11/2016 1451   BUN 7 04/29/2019 1446   BUN 8.3 11/11/2016 1451   CREATININE 0.75 04/29/2019 1446   CREATININE 0.7 11/11/2016 1451      Component Value Date/Time   CALCIUM 8.4 (L) 04/29/2019 1446   CALCIUM 9.3 11/11/2016 1451   ALKPHOS 106 04/29/2019 1446   ALKPHOS 119 11/11/2016 1451   AST 13 (L) 04/29/2019 1446   AST 13 11/11/2016 1451   ALT 15 04/29/2019 1446    ALT 15 11/11/2016 1451   BILITOT 0.3 04/29/2019 1446   BILITOT 0.29 11/11/2016 1451       RADIOGRAPHIC STUDIES: Ct Chest Wo Contrast  Result Date: 04/29/2019 CLINICAL DATA:  Persistent cough.  History of right lung carcinoid. EXAM: CT CHEST WITHOUT CONTRAST TECHNIQUE: Multidetector CT imaging of the chest was performed following the standard protocol without IV contrast. COMPARISON:  04/26/2018 FINDINGS: Cardiovascular: Heart is normal in size.  No pericardial effusion. No evidence of thoracic aortic aneurysm. Mediastinum/Nodes: No suspicious mediastinal lymphadenopathy. Visualized thyroid is unremarkable. Lungs/Pleura: Status post right upper lobe wedge resection. No suspicious pulmonary nodules. Mild subpleural reticulation anteriorly in the right middle lobe. No focal consolidation. No pleural effusion or pneumothorax. Upper Abdomen: Visualized upper abdomen is grossly unremarkable. Musculoskeletal: Mild degenerative changes of the visualized thoracolumbar spine. IMPRESSION: Status post right upper lobe wedge  resection. No evidence of recurrent or metastatic disease. Electronically Signed   By: Julian Hy M.D.   On: 04/29/2019 16:49    ASSESSMENT AND PLAN:  This is a very pleasant 53 years old white female with a stage IIa atypical carcinoid status post right upper lobe superior segmentectomy. She is currently on observation and feeling very well. The patient has no concerning complaints today. She had repeat CT scan of the chest performed recently.  I personally and independently reviewed the scans and discussed the results with the patient today. Her scan showed no concerning findings for disease recurrence. I recommended for the patient to continue on observation with repeat CT scan of the chest in 1 year. She was advised to call if she has any concerning symptoms in the interval. The patient voices understanding of current disease status and treatment options and is in agreement  with the current care plan. All questions were answered. The patient knows to call the clinic with any problems, questions or concerns. We can certainly see the patient much sooner if necessary. I spent 10 minutes counseling the patient face to face. The total time spent in the appointment was 15 minutes. Disclaimer: This note was dictated with voice recognition software. Similar sounding words can inadvertently be transcribed and may not be corrected upon review.

## 2019-05-03 ENCOUNTER — Ambulatory Visit (INDEPENDENT_AMBULATORY_CARE_PROVIDER_SITE_OTHER): Payer: 59 | Admitting: Pulmonary Disease

## 2019-05-03 ENCOUNTER — Encounter: Payer: Self-pay | Admitting: Pulmonary Disease

## 2019-05-03 ENCOUNTER — Telehealth: Payer: Self-pay | Admitting: Internal Medicine

## 2019-05-03 VITALS — BP 114/70 | HR 75 | Temp 97.8°F | Ht 68.0 in | Wt 269.0 lb

## 2019-05-03 DIAGNOSIS — J4530 Mild persistent asthma, uncomplicated: Secondary | ICD-10-CM

## 2019-05-03 NOTE — Progress Notes (Signed)
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   Maintained on Symbicort inhaler.  States that symptoms are stable with no recent exacerbations.  Seen in the office last in March 2020 for strep throat and treated with Augmentin.  Pets: Has an outdoor dog.  No birds, farm animals Occupation: Works in a [employer], [employer] Exposures: No known exposures, no mold, hot tub, Jacuzzi Smoking history: Minimal smoking as a teenager Travel history: No significant travel history Relevant family history: No significant family history of lung disease ACQ7 05/03/2019- 1.71  Outpatient Encounter Medications as of 05/03/2019  Medication Sig  . azelastine (ASTELIN) 0.1 % nasal spray Place 2 sprays into both nostrils 2 (two) times daily.  . budesonide-formoterol (SYMBICORT) 160-4.5 MCG/ACT inhaler Inhale 2 puffs into the lungs 2 (two) times daily.  . chlorpheniramine (CHLOR-TRIMETON) 4 MG tablet Take 3 tablets (12 mg total) by mouth at bedtime.  . fluticasone (FLONASE) 50 MCG/ACT nasal spray Place 2 sprays into both nostrils daily.  Marland Kitchen omeprazole (PRILOSEC) 20 MG capsule Take 1 capsule (20 mg total) by mouth daily.  . ranitidine (ZANTAC) 150 MG tablet Take 150 mg by mouth 2 (two) times daily.  . [DISCONTINUED] amoxicillin-clavulanate (AUGMENTIN) 875-125 MG tablet Take 1 tablet by mouth 2 (two) times daily.  . [DISCONTINUED] Azelastine-Fluticasone (DYMISTA) 137-50 MCG/ACT SUSP Place 2 sprays into both nostrils daily.  . [DISCONTINUED] azithromycin (ZITHROMAX) 250 MG tablet Take 2 tablets (500 mg) on day 1, then take 1  tablet (250 mg) on days 2-5  . [DISCONTINUED] Diphenhyd-Hydrocort-Nystatin (FIRST-DUKES MOUTHWASH) SUSP Use as directed 5 mLs in the mouth or throat 4 (four) times daily - after meals and at bedtime.  . [DISCONTINUED] HYDROcodone-Homatropine (TUSSIGON) 5-1.5 MG TABS Take 1 tablet by mouth every 6 (six) hours as needed.  . [DISCONTINUED] moxifloxacin (AVELOX) 400 MG tablet Take 1 tablet (400 mg total) by mouth daily.  . [DISCONTINUED] predniSONE (DELTASONE) 10 MG tablet Take 4 tabs for 2 days, then 3 tabs for 2 days, then 2 tabs for 2 days, then 1 tab for 2 days, then stop   No facility-administered encounter medications on file as of 05/03/2019.    Physical Exam: Blood pressure 114/70, pulse 75, temperature 97.8 F (36.6 C), temperature source Oral, height 5\' 8"  (1.727 m), weight 269 lb (122 kg), SpO2 99 %. Gen:      No acute distress HEENT:  EOMI, sclera anicteric Neck:     No masses; no thyromegaly Lungs:    Clear to auscultation bilaterally; normal respiratory effort CV:         Regular rate and rhythm; no murmurs Abd:      + bowel sounds; soft, non-tender; no palpable masses, no distension Ext:    No edema; adequate peripheral perfusion Skin:      Warm and dry; no rash Neuro: alert and oriented x 3 Psych: normal mood and affect  Data Reviewed: Imaging: CT chest 04/29/2019- postsurgical changes of right upper lobe wedge resection.  No suspicious pulmonary nodule.  Mild subpleural reticulation in the right middle lobe.  I reviewed the images personally  PFTs: 01/20/2016  FVC 2.10 (70%), FEV1 2.58 (82%), F/F 83, TLC 84%, DLCO 86% Normal test   08/24/2016 FVC 3.01 [96%], FEV1 2.47 [79%], F/F 82 No obstruction.  No bronchodilator response.  Labs: CBC 6/22- WBC 5.3, eos 1%, absolute eosinophil count 53 RAST panel 06/01/2018-IgE less than 2.  Negative allergies.  Assessment:  Mild asthma Stable on Symbicort inhaler.  No need for rescue medication.  I have told her to use extra puffs  of Symbicort as needed Symptoms currently well controlled  Allergic rhinitis, GERD Continue Flonase, antihistamine, Protonix  Plan/Recommendations: -Continue Symbicort Follow-up in 6 months  Marshell Garfinkel MD [facility] 05/03/2019, 8:59 AM  CC: Windell Hummingbird, PA-C

## 2019-05-03 NOTE — Telephone Encounter (Signed)
Scheduled appt per 6/25 los - mailed letter with appt date and time - f/u in one year.  Central radiology to contact pt with scan .

## 2019-05-03 NOTE — Patient Instructions (Signed)
I am glad you are doing well with regard to your breathing Continue the Symbicort inhaler and follow-up in 6 months Please give [place] a call sooner if there is any change in your symptoms

## 2019-08-15 ENCOUNTER — Ambulatory Visit: Payer: 59 | Admitting: Pulmonary Disease

## 2019-09-07 ENCOUNTER — Encounter: Payer: Self-pay | Admitting: Internal Medicine

## 2019-09-12 ENCOUNTER — Ambulatory Visit: Payer: 59 | Admitting: Pulmonary Disease

## 2020-05-01 ENCOUNTER — Inpatient Hospital Stay: Payer: No Typology Code available for payment source | Attending: Internal Medicine

## 2020-05-01 ENCOUNTER — Other Ambulatory Visit: Payer: Self-pay

## 2020-05-01 ENCOUNTER — Encounter (HOSPITAL_COMMUNITY): Payer: Self-pay

## 2020-05-01 ENCOUNTER — Ambulatory Visit (HOSPITAL_COMMUNITY)
Admission: RE | Admit: 2020-05-01 | Discharge: 2020-05-01 | Disposition: A | Discharge status: Home / Self Care | Payer: PRIVATE HEALTH INSURANCE | Source: Ambulatory Visit | Attending: Internal Medicine | Admitting: Internal Medicine

## 2020-05-01 DIAGNOSIS — Z85110 Personal history of malignant carcinoid tumor of bronchus and lung: Secondary | ICD-10-CM | POA: Insufficient documentation

## 2020-05-01 DIAGNOSIS — Z08 Encounter for follow-up examination after completed treatment for malignant neoplasm: Secondary | ICD-10-CM | POA: Insufficient documentation

## 2020-05-01 DIAGNOSIS — C3490 Malignant neoplasm of unspecified part of unspecified bronchus or lung: Secondary | ICD-10-CM | POA: Insufficient documentation

## 2020-05-01 LAB — CBC WITH DIFFERENTIAL (CANCER CENTER ONLY)
Abs Immature Granulocytes: 0.01 10*3/uL (ref 0.00–0.07)
Basophils Absolute: 0 10*3/uL (ref 0.0–0.1)
Basophils Relative: 1 %
Eosinophils Absolute: 0.1 10*3/uL (ref 0.0–0.5)
Eosinophils Relative: 2 %
HCT: 40.7 % (ref 36.0–46.0)
Hemoglobin: 12.4 g/dL (ref 12.0–15.0)
Immature Granulocytes: 0 %
Lymphocytes Relative: 35 %
Lymphs Abs: 1.7 10*3/uL (ref 0.7–4.0)
MCH: 24.8 pg — ABNORMAL LOW (ref 26.0–34.0)
MCHC: 30.5 g/dL (ref 30.0–36.0)
MCV: 81.4 fL (ref 80.0–100.0)
Monocytes Absolute: 0.4 10*3/uL (ref 0.1–1.0)
Monocytes Relative: 8 %
Neutro Abs: 2.6 10*3/uL (ref 1.7–7.7)
Neutrophils Relative %: 54 %
Platelet Count: 183 10*3/uL (ref 150–400)
RBC: 5 MIL/uL (ref 3.87–5.11)
RDW: 15.9 % — ABNORMAL HIGH (ref 11.5–15.5)
WBC Count: 4.8 10*3/uL (ref 4.0–10.5)
nRBC: 0 % (ref 0.0–0.2)

## 2020-05-01 LAB — CMP (CANCER CENTER ONLY)
ALT: 17 U/L (ref 0–44)
AST: 12 U/L — ABNORMAL LOW (ref 15–41)
Albumin: 3.8 g/dL (ref 3.5–5.0)
Alkaline Phosphatase: 111 U/L (ref 38–126)
Anion gap: 9 (ref 5–15)
BUN: 7 mg/dL (ref 6–20)
CO2: 24 mmol/L (ref 22–32)
Calcium: 8.7 mg/dL — ABNORMAL LOW (ref 8.9–10.3)
Chloride: 110 mmol/L (ref 98–111)
Creatinine: 0.72 mg/dL (ref 0.44–1.00)
GFR, Est AFR Am: >60 mL/min (ref >60)
GFR, Estimated: >60 mL/min (ref >60)
Glucose, Bld: 98 mg/dL (ref 70–99)
Potassium: 4 mmol/L (ref 3.5–5.1)
Sodium: 143 mmol/L (ref 135–145)
Total Bilirubin: 0.3 mg/dL (ref 0.3–1.2)
Total Protein: 6.6 g/dL (ref 6.5–8.1)

## 2020-05-01 MED ORDER — SODIUM CHLORIDE (PF) 0.9 % IJ SOLN
INTRAMUSCULAR | Status: AC
  Filled 2020-05-01: qty 50

## 2020-05-01 MED ORDER — IOHEXOL 300 MG/ML  SOLN
75.0000 mL | Freq: Once | INTRAMUSCULAR | Status: AC | PRN
  Administered 2020-05-01: 75 mL via INTRAVENOUS

## 2020-05-04 ENCOUNTER — Inpatient Hospital Stay (HOSPITAL_BASED_OUTPATIENT_CLINIC_OR_DEPARTMENT_OTHER): Payer: No Typology Code available for payment source | Admitting: Internal Medicine

## 2020-05-04 ENCOUNTER — Other Ambulatory Visit: Payer: Self-pay

## 2020-05-04 ENCOUNTER — Encounter: Payer: Self-pay | Admitting: Internal Medicine

## 2020-05-04 VITALS — BP 121/77 | HR 67 | Temp 97.7°F | Resp 18 | Ht 68.0 in | Wt 274.4 lb

## 2020-05-04 DIAGNOSIS — Z85110 Personal history of malignant carcinoid tumor of bronchus and lung: Secondary | ICD-10-CM | POA: Diagnosis present

## 2020-05-04 DIAGNOSIS — C7A090 Malignant carcinoid tumor of the bronchus and lung: Secondary | ICD-10-CM

## 2020-05-04 DIAGNOSIS — Z08 Encounter for follow-up examination after completed treatment for malignant neoplasm: Secondary | ICD-10-CM | POA: Diagnosis not present

## 2020-05-04 NOTE — Progress Notes (Signed)
[facility] [phone]   [phone]  OFFICE PROGRESS NOTE  Windell Hummingbird, PA-C [address] 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 Roxan Hockey.  CURRENT THERAPY: Observation.  INTERVAL HISTORY: Anna Larson 54 y.o. female returns to the clinic today for follow-up visit.  The patient is feeling fine today with no concerning complaints.  She denied having any chest pain, shortness of breath, cough or hemoptysis.  She denied having any fever or chills.  She has no nausea, vomiting, diarrhea or constipation.  She has no recent weight loss or night sweats.  She has no headache or visual changes.  She had repeat CT scan of the chest performed recently and she is here for evaluation and discussion of her risk her results.   MEDICAL HISTORY: Past Medical History:  Diagnosis Date  . Allergic rhinitis   . Anemia    Takes Ferrous sulfate  . Arthritis   . Asthma   . COPD (chronic obstructive pulmonary disease) ([facility])   . Cough   . GERD (gastroesophageal reflux disease)   . History of kidney stones    x 3 episodes  . Hx of adenomatous + sessile serrated colonic polyps 09/22/2015  . Lung nodule < 6cm on CT   . Pneumonia    hx of  . Primary atypical carcinoid tumor of right lung ([facility])    Right upper lobe segmentectomy 2016- T1N1    ALLERGIES:  is allergic to codeine.  MEDICATIONS:  Current Outpatient Medications  Medication Sig Dispense Refill  . azelastine (ASTELIN) 0.1 % nasal spray Place 2 sprays into both nostrils 2 (two) times daily. 30 mL 5  . budesonide-formoterol (SYMBICORT) 160-4.5 MCG/ACT inhaler Inhale 2 puffs into the lungs 2 (two) times daily. 1 Inhaler 0  . chlorpheniramine (CHLOR-TRIMETON) 4 MG tablet  Take 3 tablets (12 mg total) by mouth at bedtime. 90 tablet 2  . fluticasone (FLONASE) 50 MCG/ACT nasal spray Place 2 sprays into both nostrils daily. 16 g 5  . omeprazole (PRILOSEC) 20 MG capsule Take 1 capsule (20 mg total) by mouth daily. 30 capsule 0  . ranitidine (ZANTAC) 150 MG tablet Take 150 mg by mouth 2 (two) times daily.     No current facility-administered medications for this visit.    SURGICAL HISTORY:  Past Surgical History:  Procedure Laterality Date  . CESAREAN SECTION    . COLONOSCOPY WITH PROPOFOL N/A 09/22/2015   Procedure: COLONOSCOPY WITH PROPOFOL;  Surgeon: Gatha Mayer, MD;  Location: [facility];  Service: Endoscopy;  Laterality: N/A;  . ESOPHAGOGASTRODUODENOSCOPY (EGD) WITH PROPOFOL N/A 09/22/2015   Procedure: ESOPHAGOGASTRODUODENOSCOPY (EGD) WITH PROPOFOL;  Surgeon: Gatha Mayer, MD;  Location: [facility];  Service: Endoscopy;  Laterality: N/A;  . LITHOTRIPSY     x2  . SEGMENTECOMY Right 10/12/2015   Procedure: RIGHT UPPER LOBE SEGMENTECTOMY;  Surgeon: [name], MD;  Location: [facility];  Service: Thoracic;  Laterality: Right;  . TUBAL LIGATION    . VIDEO ASSISTED THORACOSCOPY Right 10/12/2015   Procedure: VIDEO ASSISTED THORACOSCOPY;  Surgeon: [name], MD;  Location: [facility];  Service: Thoracic;  Laterality: Right;    REVIEW OF SYSTEMS:  A comprehensive review of systems was negative.   PHYSICAL EXAMINATION: General appearance:  alert, cooperative and no distress Head: Normocephalic, without obvious abnormality, atraumatic Neck: no adenopathy, no JVD, supple, symmetrical, trachea midline and thyroid not enlarged, symmetric, no tenderness/mass/nodules Lymph nodes: Cervical, supraclavicular, and axillary nodes normal. Resp: clear to auscultation bilaterally Back: symmetric, no curvature. ROM normal. No CVA tenderness. Cardio: regular rate and rhythm, S1, S2 normal, no murmur, click, rub or gallop GI: soft, non-tender; bowel sounds  normal; no masses,  no organomegaly Extremities: extremities normal, atraumatic, no cyanosis or edema  ECOG PERFORMANCE STATUS: 1 - Symptomatic but completely ambulatory  Blood pressure 121/77, pulse 67, temperature 97.7 F (36.5 C), temperature source Temporal, resp. rate 18, height 5\' 8"  (1.727 m), weight 274 lb 6.4 oz (124.5 kg), last menstrual period 04/30/2020, SpO2 99 %.  LABORATORY DATA: Lab Results  Component Value Date   WBC 4.8 05/01/2020   HGB 12.4 05/01/2020   HCT 40.7 05/01/2020   MCV 81.4 05/01/2020   PLT 183 05/01/2020      Chemistry      Component Value Date/Time   NA 143 05/01/2020 0858   NA 141 11/11/2016 1451   K 4.0 05/01/2020 0858   K 4.2 11/11/2016 1451   CL 110 05/01/2020 0858   CO2 24 05/01/2020 0858   CO2 26 11/11/2016 1451   BUN 7 05/01/2020 0858   BUN 8.3 11/11/2016 1451   CREATININE 0.72 05/01/2020 0858   CREATININE 0.7 11/11/2016 1451      Component Value Date/Time   CALCIUM 8.7 (L) 05/01/2020 0858   CALCIUM 9.3 11/11/2016 1451   ALKPHOS 111 05/01/2020 0858   ALKPHOS 119 11/11/2016 1451   AST 12 (L) 05/01/2020 0858   AST 13 11/11/2016 1451   ALT 17 05/01/2020 0858   ALT 15 11/11/2016 1451   BILITOT 0.3 05/01/2020 0858   BILITOT 0.29 11/11/2016 1451       RADIOGRAPHIC STUDIES: CT Chest W Contrast  Result Date: 05/01/2020 CLINICAL DATA:  Primary Cancer Type: Lung Imaging Indication: Routine surveillance Interval therapy since last imaging? No Initial Cancer Diagnosis Date: 10/12/2015; Established by: Biopsy-proven Detailed Pathology: Stage IIA non-small cell lung cancer; neuroendocrine tumor, atypical carcinoid. Primary Tumor location: Right upper lobe. Surgeries: Right upper lobe posterior segmentectomy 10/12/2015. Chemotherapy: No Immunotherapy? No Radiation therapy? No EXAM: CT CHEST WITH CONTRAST TECHNIQUE: Multidetector CT imaging of the chest was performed during intravenous contrast administration. CONTRAST:  28mL OMNIPAQUE IOHEXOL  300 MG/ML  SOLN COMPARISON:  Most recent CT chest 04/29/2019.  08/10/2015 PET-CT. FINDINGS: Cardiovascular: Bovine arch. Tortuous thoracic aorta. Mild cardiomegaly, without pericardial effusion. No central pulmonary embolism, on this non-dedicated study. Mediastinum/Nodes: No supraclavicular adenopathy. No mediastinal or hilar adenopathy. Lungs/Pleura: No pleural fluid. Minimal motion degradation within the mid chest. Posterior right upper lobe wedge resection. Upper Abdomen: Mild nonspecific caudate lobe prominence. Variant lateral segment left liver lobe extending into the left upper quadrant. Subtle gallstone of 1.4 cm. Normal imaged portions of the spleen, stomach, pancreas, adrenal glands, kidneys. Musculoskeletal: Mild convex right thoracic spine curvature. A tiny upper thoracic vertebral body hemangioma. IMPRESSION: 1. Status post right upper lobe wedge resection, without recurrent or metastatic disease. 2. Cholelithiasis. Electronically Signed   By: Abigail Miyamoto M.D.   On: 05/01/2020 10:42    ASSESSMENT AND PLAN:  This is a very pleasant 54 years old white female with a stage IIa atypical carcinoid status post right upper lobe superior segmentectomy. She is currently on observation and feeling very well. The patient has been in observation for the last 5 years and  she is feeling fine. She had repeat CT scan of the chest performed recently.  I personally and independently reviewed the scan and discussed the results with the patient today. Her scan showed no concerning findings for disease recurrence or progression. I recommended for the patient to continue on observation with routine follow-up visit by her primary care physician at this point. I will be happy to see her in the future if needed. The patient was advised to call immediately if she has any concerning symptoms in the interval. The patient voices understanding of current disease status and treatment options and is in agreement with the  current care plan. All questions were answered. The patient knows to call the clinic with any problems, questions or concerns. We can certainly see the patient much sooner if necessary.  Disclaimer: This note was dictated with voice recognition software. Similar sounding words can inadvertently be transcribed and may not be corrected upon review.

## 2020-05-06 ENCOUNTER — Ambulatory Visit (INDEPENDENT_AMBULATORY_CARE_PROVIDER_SITE_OTHER): Payer: No Typology Code available for payment source | Admitting: Pulmonary Disease

## 2020-05-06 ENCOUNTER — Other Ambulatory Visit: Payer: Self-pay

## 2020-05-06 ENCOUNTER — Encounter: Payer: Self-pay | Admitting: Pulmonary Disease

## 2020-05-06 VITALS — BP 138/64 | HR 77 | Temp 98.2°F | Ht 69.0 in | Wt 276.0 lb

## 2020-05-06 DIAGNOSIS — J4530 Mild persistent asthma, uncomplicated: Secondary | ICD-10-CM

## 2020-05-06 MED ORDER — BUDESONIDE-FORMOTEROL FUMARATE 160-4.5 MCG/ACT IN AERO: 2.0000 | INHALATION_SPRAY | Freq: Two times a day (BID) | RESPIRATORY_TRACT | 5 refills | Status: DC

## 2020-05-06 MED ORDER — AZELASTINE HCL 0.1 % NA SOLN: 2.0000 | Freq: Two times a day (BID) | NASAL | 5 refills | Status: DC

## 2020-05-06 MED ORDER — FLUTICASONE PROPIONATE 50 MCG/ACT NA SUSP: 2.0000 | Freq: Every day | NASAL | 5 refills | Status: DC

## 2020-05-06 NOTE — Progress Notes (Signed)
         Anna Larson    [id number]    08/15/1966  Primary Care Physician:Taylor, Randolm Idol  Referring Physician: Windell Hummingbird, PA-C [address] 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   Maintained on Symbicort inhaler.  States that symptoms are stable with no recent exacerbations.  Seen in the office last in March 2020 for strep throat and treated with Augmentin.  Pets: Has an outdoor dog.  No birds, farm animals Occupation: Works in a Surveyor, mining Exposures: No known exposures, no mold, hot tub, Jacuzzi Smoking history: Minimal smoking as a teenager Travel history: No significant travel history Relevant family history: No significant family history of lung disease ACQ7 05/03/2019- 1.71  Outpatient Encounter Medications as of 05/06/2020  Medication Sig  . azelastine (ASTELIN) 0.1 % nasal spray Place 2 sprays into both nostrils 2 (two) times daily.  . budesonide-formoterol (SYMBICORT) 160-4.5 MCG/ACT inhaler Inhale 2 puffs into the lungs 2 (two) times daily.  . chlorpheniramine (CHLOR-TRIMETON) 4 MG tablet Take 3 tablets (12 mg total) by mouth at bedtime.  . fluticasone (FLONASE) 50 MCG/ACT nasal spray Place 2 sprays into both nostrils daily.  Marland Kitchen omeprazole (PRILOSEC) 20 MG capsule Take 1 capsule (20 mg total) by mouth daily.  . ranitidine (ZANTAC) 150 MG tablet Take 150 mg by mouth 2 (two) times daily.  . [DISCONTINUED] Azelastine-Fluticasone (DYMISTA) 137-50 MCG/ACT SUSP Place 2 sprays into both nostrils daily.   No facility-administered encounter medications on file as of 05/06/2020.   Physical Exam: Blood pressure 114/70, pulse 75, temperature 97.8 F (36.6 C), temperature source Oral, height 5\' 8"  (1.727 m), weight 269 lb  (122 kg), SpO2 99 %. Gen:      No acute distress HEENT:  EOMI, sclera anicteric Neck:     No masses; no thyromegaly Lungs:    Clear to auscultation bilaterally; normal respiratory effort CV:         Regular rate and rhythm; no murmurs Abd:      + bowel sounds; soft, non-tender; no palpable masses, no distension Ext:    No edema; adequate peripheral perfusion Skin:      Warm and dry; no rash Neuro: alert and oriented x 3 Psych: normal mood and affect  Data Reviewed: Imaging: CT chest 04/29/2019- postsurgical changes of right upper lobe wedge resection.  No suspicious pulmonary nodule.  Mild subpleural reticulation in the right middle lobe.  I reviewed the images personally  PFTs: 01/20/2016 FVC 2.10 (70%), FEV1 2.58 (82%), F/F 83, TLC 84%, DLCO 86% Normal test   ACT score 05/06/20- 19  08/24/2016 FVC 3.01 [96%], FEV1 2.47 [79%], F/F 82 No obstruction.  No bronchodilator response.  Labs: CBC 6/22- WBC 5.3, eos 1%, absolute eosinophil count 53 RAST panel 06/01/2018-IgE less than 2.  Negative allergies.  Assessment:  Mild asthma Stable on Symbicort inhaler.  No need for rescue medication.  I have told her to use extra puffs of Symbicort as needed Symptoms currently well controlled  Allergic rhinitis, GERD Continue Flonase, antihistamine, Protonix  Plan/Recommendations: -Continue Symbicort Follow-up in 6 months  Marshell Garfinkel MD [facility] 05/06/2020, 9:24 AM  CC: Windell Hummingbird, PA-C

## 2020-05-06 NOTE — Addendum Note (Signed)
Addended by: Elton Sin on: 05/06/2020 09:34 AM   Modules accepted: Orders

## 2020-05-06 NOTE — Progress Notes (Signed)
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   Pets: Has an outdoor dog.  No birds, farm animals Occupation: Works in a Surveyor, mining Exposures: No known exposures, no mold, hot tub, Jacuzzi Smoking history: Minimal smoking as a teenager Travel history: No significant travel history Relevant family history: No significant family history of lung disease  Interim history: Doing well on current regimen No exacerbations or ER visits.  No new complaints today.   Outpatient Encounter Medications as of 05/06/2020  Medication Sig   azelastine (ASTELIN) 0.1 % nasal spray Place 2 sprays into both nostrils 2 (two) times daily.   budesonide-formoterol (SYMBICORT) 160-4.5 MCG/ACT inhaler Inhale 2 puffs into the lungs 2 (two) times daily.   chlorpheniramine (CHLOR-TRIMETON) 4 MG tablet Take 3 tablets (12 mg total) by mouth at bedtime.   fluticasone (FLONASE) 50 MCG/ACT nasal spray Place 2 sprays into both nostrils daily.   omeprazole (PRILOSEC) 20 MG capsule Take 1 capsule (20 mg total) by mouth daily.   ranitidine (ZANTAC) 150 MG tablet Take 150 mg by mouth 2 (two) times daily.   [DISCONTINUED] Azelastine-Fluticasone (DYMISTA) 137-50 MCG/ACT SUSP Place 2 sprays into both nostrils daily.   No facility-administered encounter medications on file as of 05/06/2020.   Physical Exam: Blood pressure 138/64, pulse 77, temperature 98.2 F (36.8 C), temperature source Oral, height 5\' 9"  (1.753 m), weight 276 lb (125.2 kg), last menstrual period 04/30/2020, SpO2 98 %. Gen:      No acute distress HEENT:   EOMI, sclera anicteric Neck:     No masses; no thyromegaly Lungs:    Clear to auscultation bilaterally; normal respiratory effort CV:         Regular rate and rhythm; no murmurs Abd:      + bowel sounds; soft, non-tender; no palpable masses, no distension Ext:    No edema; adequate peripheral perfusion Skin:      Warm and dry; no rash Neuro: alert and oriented x 3 Psych: normal mood and affect  Data Reviewed: Imaging: CT chest 04/29/2019- postsurgical changes of right upper lobe wedge resection.  No suspicious pulmonary nodule.  Mild subpleural reticulation in the right middle lobe.  I reviewed the images personally  PFTs: 01/20/2016 FVC 2.10 (70%), FEV1 2.58 (82%), F/F 83, TLC 84%, DLCO 86% Normal test   08/24/2016 FVC 3.01 [96%], FEV1 2.47 [79%], F/F 82 No obstruction.  No bronchodilator response.  ACQ7 05/03/2019- 1.71 ACT score 05/06/20- 19  Labs: CBC 6/22- WBC 5.3, eos 1%, absolute eosinophil count 53 RAST panel 06/01/2018-IgE less than 2.  Negative allergies.  Assessment:  Mild asthma Stable on Symbicort inhaler. She knows to use extra puffs of Symbicort as needed for rescue medication but has not needed it so far. Symptoms currently well controlled  Allergic rhinitis, GERD Continue Flonase, antihistamine, Protonix  Plan/Recommendations: -Continue Symbicort  Follow-up in 12 months.  Marshell Garfinkel MD [facility] 05/06/2020, 9:23 AM  CC: Windell Hummingbird, PA-C

## 2020-05-06 NOTE — Patient Instructions (Signed)
I am glad you are doing well with regard to your breathing We will renew your inhalers including Symbicort, Astelin, Flonase Follow-up in 1 year.

## 2020-08-05 ENCOUNTER — Other Ambulatory Visit: Payer: Self-pay

## 2020-08-05 ENCOUNTER — Encounter: Payer: Self-pay | Admitting: Acute Care

## 2020-08-05 ENCOUNTER — Ambulatory Visit (INDEPENDENT_AMBULATORY_CARE_PROVIDER_SITE_OTHER): Payer: No Typology Code available for payment source

## 2020-08-05 ENCOUNTER — Ambulatory Visit (INDEPENDENT_AMBULATORY_CARE_PROVIDER_SITE_OTHER): Payer: No Typology Code available for payment source | Admitting: Acute Care

## 2020-08-05 VITALS — BP 122/88 | HR 80 | Temp 98.4°F | Ht 70.0 in | Wt 271.8 lb

## 2020-08-05 DIAGNOSIS — J45909 Unspecified asthma, uncomplicated: Secondary | ICD-10-CM | POA: Diagnosis not present

## 2020-08-05 DIAGNOSIS — J440 Chronic obstructive pulmonary disease with acute lower respiratory infection: Secondary | ICD-10-CM | POA: Diagnosis not present

## 2020-08-05 DIAGNOSIS — J441 Chronic obstructive pulmonary disease with (acute) exacerbation: Secondary | ICD-10-CM | POA: Diagnosis not present

## 2020-08-05 DIAGNOSIS — J209 Acute bronchitis, unspecified: Secondary | ICD-10-CM

## 2020-08-05 DIAGNOSIS — K219 Gastro-esophageal reflux disease without esophagitis: Secondary | ICD-10-CM

## 2020-08-05 DIAGNOSIS — R0681 Apnea, not elsewhere classified: Secondary | ICD-10-CM

## 2020-08-05 MED ORDER — ZITHROMAX Z-PAK 250 MG PO TABS: ORAL_TABLET | ORAL | 0 refills | Status: DC

## 2020-08-05 MED ORDER — PREDNISONE 10 MG PO TABS: ORAL_TABLET | ORAL | 0 refills | Status: DC

## 2020-08-05 NOTE — Progress Notes (Signed)
History of Present Illness Anna Larson is a 54 y.o. 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 She is followed by [name]   Maintained on Symbicort inhaler.  01/2019 Strep Throat>> Treated with Augmentin   Seen in the office last in 04/2020 by [name]  08/05/2020  Pt.presents for acute OV for new right upper chest pain with cough. She states she has a new cough that she feels is related to heat and possibly seasonal allergies. Cough  started about 2 weeks ago. She has started having pain with the coughing since Monday ( 2 days ago). She states she has a minimally productive cough that is wet at times and dry at times.  She states she has not noticed any wheezing, but states her husband did hear some. No fever, just cough.Secretions are slightly yellow. She states she is otherwise at her baseline from a pulmonary perspective. She states she does have some post nasal drip.  No weight loss of hemoptysis. No shortness of breath. She does have a history of bronchitis. She states it sometimes presents this way. She does not want this to get out of hand, so she has come in early to be proactive.   Test Results: Imaging: CT chest 04/29/2019- postsurgical changes of right upper lobe wedge resection.  No suspicious pulmonary nodule.  Mild subpleural reticulation in the right middle lobe.  I reviewed the images personally  PFTs: 01/20/2016 FVC 2.10 (70%), FEV1 2.58 (82%), F/F 83, TLC 84%, DLCO 86% Normal test   ACT score 05/06/20- 19  08/24/2016 FVC 3.01 [96%], FEV1 2.47 [79%], F/F 82 No obstruction.  No bronchodilator response.  Labs: CBC 6/22- WBC 5.3, eos 1%, absolute eosinophil count 53 RAST panel 06/01/2018-IgE less than 2.  Negative allergies.   CBC Latest Ref Rng & Units 05/01/2020 04/29/2019 04/11/2018  WBC 4.0 -  10.5 K/uL 4.8 5.3 7.7  Hemoglobin 12.0 - 15.0 g/dL 12.4 13.8 9.2(L)  Hematocrit 36 - 46 % 40.7 44.7 31.7(L)  Platelets 150 - 400 K/uL 183 186 193.0    BMP Latest Ref Rng & Units 05/01/2020 04/29/2019 11/11/2016  Glucose 70 - 99 mg/dL 98 95 88  BUN 6 - 20 mg/dL 7 7 8.3  Creatinine 0.44 - 1.00 mg/dL 0.72 0.75 0.7  Sodium 135 - 145 mmol/L 143 140 141  Potassium 3.5 - 5.1 mmol/L 4.0 3.8 4.2  Chloride 98 - 111 mmol/L 110 107 -  CO2 22 - 32 mmol/L 24 26 26   Calcium 8.9 - 10.3 mg/dL 8.7(L) 8.4(L) 9.3    BNP No results found for: BNP  ProBNP No results found for: PROBNP  PFT    Component Value Date/Time   FEV1PRE 2.43 08/24/2016 1429   FEV1POST 2.47 08/24/2016 1429   FVCPRE 3.04 08/24/2016 1429   FVCPOST 3.01 08/24/2016 1429   TLC 4.66 01/20/2016 1458   DLCOUNC 24.61 01/20/2016 1458   PREFEV1FVCRT 80 08/24/2016 1429   PSTFEV1FVCRT 82 08/24/2016 1429    No results found.   Past medical hx Past Medical History:  Diagnosis Date  . Allergic rhinitis   . Anemia    Takes Ferrous sulfate  . Arthritis   . Asthma   . COPD (chronic obstructive pulmonary disease) ([facility])   . Cough   . GERD (gastroesophageal reflux disease)   . History of kidney stones    x  3 episodes  . Hx of adenomatous + sessile serrated colonic polyps 09/22/2015  . Lung nodule < 6cm on CT   . Pneumonia    hx of  . Primary atypical carcinoid tumor of right lung [facility])    Right upper lobe segmentectomy 2016- T1N1     Social History   Tobacco Use  . Smoking status: Former Research scientist (life sciences)  . Smokeless tobacco: Never Used  . Tobacco comment: both mothers smoked: States she smoked as a teen, very little  Media planner  . Vaping Use: Never used  Substance Use Topics  . Alcohol use: Yes    Alcohol/week: 0.0 standard drinks    Comment: occasional  . Drug use: No    Ms.Waller reports that she has quit smoking. She has never used smokeless tobacco. She reports current alcohol use. She reports that she does not use  drugs.  Tobacco Cessation: Counseling given: Not Answered Comment: both mothers smoked: States she smoked as a teen, very little   Past surgical hx, Family hx, Social hx all reviewed.  Current Outpatient Medications on File Prior to Visit  Medication Sig  . azelastine (ASTELIN) 0.1 % nasal spray Place 2 sprays into both nostrils 2 (two) times daily.  . budesonide-formoterol (SYMBICORT) 160-4.5 MCG/ACT inhaler Inhale 2 puffs into the lungs 2 (two) times daily.  . chlorpheniramine (CHLOR-TRIMETON) 4 MG tablet Take 3 tablets (12 mg total) by mouth at bedtime.  . fluticasone (FLONASE) 50 MCG/ACT nasal spray Place 2 sprays into both nostrils daily.  Marland Kitchen omeprazole (PRILOSEC) 20 MG capsule Take 1 capsule (20 mg total) by mouth daily.  . ranitidine (ZANTAC) 150 MG tablet Take 150 mg by mouth 2 (two) times daily.  . [DISCONTINUED] Azelastine-Fluticasone (DYMISTA) 137-50 MCG/ACT SUSP Place 2 sprays into both nostrils daily.   No current facility-administered medications on file prior to visit.     Allergies  Allergen Reactions  . Codeine Rash    Review Of Systems:  Constitutional:   No  weight loss, night sweats,  Fevers, chills, fatigue, or  lassitude.  HEENT:   No headaches,  Difficulty swallowing,  Tooth/dental problems, or  Sore throat,                No sneezing, itching, ear ache, nasal congestion, post nasal drip,   CV:  + chest pain from cough, No  Orthopnea, PND, swelling in lower extremities, anasarca, dizziness, palpitations, syncope.   GI  No heartburn, indigestion, abdominal pain, nausea, vomiting, diarrhea, change in bowel habits, loss of appetite, bloody stools.   Resp: No shortness of breath with exertion or at rest.  No excess mucus, + productive cough,  + non-productive cough,  No coughing up of blood.  No change in color of mucus.  No wheezing.  No chest wall deformity  Skin: no rash or lesions.  GU: no dysuria, change in color of urine, no urgency or frequency.  No  flank pain, no hematuria   MS:  No joint pain or swelling.  No decreased range of motion.  No back pain.  Psych:  No change in mood or affect. No depression or anxiety.  No memory loss.   Vital Signs BP 122/88 (BP Location: Left Arm, Cuff Size: Normal)   Pulse 80   Temp 98.4 F (36.9 C) (Oral)   Ht 5\' 10"  (1.778 m)   Wt 271 lb 12.8 oz (123.3 kg)   SpO2 100%   BMI 39.00 kg/m    Physical Exam:  General- No  distress,  A&Ox3, pleasant ENT: No sinus tenderness, TM clear, pale nasal mucosa, no oral exudate,no post nasal drip, no LAN Cardiac: S1, S2, regular rate and rhythm, no murmur Chest: No wheeze/ rales/ dullness; no accessory muscle use, no nasal flaring, no sternal retractions Abd.: Soft Non-tender, ND, BS + Ext: No clubbing cyanosis, edema Neuro:  normal strength, MAE x 4, A&O x 3 Skin: No rashes, No lesions, warm and dry Psych: normal mood and behavior   Assessment/Plan  Acute Bronchitis Chest pain 2/2 coughing Plan We will do a CXR today. We will call you with results We will prescribe a Prednisone taper; 10 mg tablets: 4 tabs x 2 days, 3 tabs x 2 days, 2 tabs x 2 days 1 tab x 2 days then stop. We will order a Z pack, take until gone. Take probiotic or eat Activia yogurt while taking  antibiotic  Tylenol for pain as directed on bottle Deslym cough syrup during the day for cough Robitussin Cough syrup as needed at bedtime for cough Follow up in December with [name] as he specified in June 2022. Please contact office for sooner follow up if symptoms do not improve or worsen or seek emergency care   Asthma/ Allergic rhinitis Plan Continue Symbicort, Astelin , Chlor trimeton and Flonase as you have been doing  Continue rescue inhaler for breakthrough shortness of breath or wheezing.   GERD Plan Continue Prilosec and Zantac as you have been doing.   Magdalen Spatz, NP 08/05/2020  4:11 PM

## 2020-08-05 NOTE — Patient Instructions (Addendum)
It is good to see you today. We will do a CXR today. We will call you with results We will prescribe a Prednisone taper; 10 mg tablets: 4 tabs x 2 days, 3 tabs x 2 days, 2 tabs x 2 days 1 tab x 2 days then stop. We will order a Z pack, take until gone. Take probiotic or eat Activia yogurt while taking  antibiotic  Deslym cough syrup during the day for cough Robitussin Cough syrup as needed at bedtime for cough Continue Symbicort, Astelin , Chlor trimeton and Flonase as you have been doing  Continue Prilosec and Zantac as you have been doing.  Call in 1 week if you are not better, so we can see you.  Tylenol for pain and inflammation. Follow up in December with [name] as he specified in June. Please contact office for sooner follow up if symptoms do not improve or worsen or seek emergency care

## 2020-08-12 NOTE — Progress Notes (Signed)
CXR is normal. Thanks

## 2020-12-07 ENCOUNTER — Telehealth: Payer: Self-pay | Admitting: Medical Oncology

## 2020-12-07 NOTE — Telephone Encounter (Signed)
Pt resolved issue before I called her back.

## 2020-12-21 ENCOUNTER — Telehealth: Payer: Self-pay | Admitting: Pulmonary Disease

## 2020-12-21 MED ORDER — PREDNISONE 20 MG PO TABS: ORAL_TABLET | ORAL | 0 refills | Status: DC

## 2020-12-21 NOTE — Telephone Encounter (Signed)
She would not qualify for the monoclonal antibody as its been a week since her symptoms began  Please call in prednisone 40 mg a day for 5 days.

## 2020-12-21 NOTE — Telephone Encounter (Signed)
Spoke with pt and advised of recommendations per Dr Vaughan Browner. Pt verbalized understanding. Prednisone rx sent to pharmacy. Nothing further needed.

## 2020-12-21 NOTE — Telephone Encounter (Signed)
Called spoke with patient.  She states her only symptom now is a cough and would like something called in.  Her symptoms started on 12/14/20 and she is fully vaccinated with booster, wasn't sure if she needed a referrral to [facility] since she is only having a cough currently.  [name] please advise on medication for cough relief and if she needs a referral to [facility]

## 2021-04-12 ENCOUNTER — Telehealth: Payer: Self-pay | Admitting: Pulmonary Disease

## 2021-04-12 DIAGNOSIS — J441 Chronic obstructive pulmonary disease with (acute) exacerbation: Secondary | ICD-10-CM

## 2021-04-12 NOTE — Telephone Encounter (Signed)
Called and spoke with patient regarding prescription for Hycodan syrup. Patient states she feels like her Bronchitis is coming back. States symptoms started on Friday. Reports having a cough that is sometimes productive. States phlegm goes from clear to yellow in color. She is also having some sinus drainage. States she has tried Robitussin and [employer], not helping. She went to an Urgent care today, was told that they do not prescribe Hycodan syrup. She was given an steroid injection at that visit.   [name] please advise  Thank you

## 2021-04-13 MED ORDER — PREDNISONE 10 MG PO TABS
10.0000 mg | ORAL_TABLET | Freq: Every day | ORAL | 0 refills | Status: DC
Start: 2021-04-13 — End: 2021-07-13

## 2021-04-13 NOTE — Telephone Encounter (Signed)
I do not believe Hycodan will help Please call in Prednisone 40 mg a day for 5 days

## 2021-04-13 NOTE — Telephone Encounter (Signed)
I called and spoke with patient regarding Dr. Vito Backers and sent in prednisone to preferred pharmacy. Patient verbalized understanding, nothing further needed.

## 2021-06-18 ENCOUNTER — Ambulatory Visit: Payer: No Typology Code available for payment source | Admitting: Adult Health

## 2021-07-13 ENCOUNTER — Other Ambulatory Visit: Payer: Self-pay

## 2021-07-13 ENCOUNTER — Encounter: Payer: Self-pay | Admitting: Pulmonary Disease

## 2021-07-13 ENCOUNTER — Ambulatory Visit (INDEPENDENT_AMBULATORY_CARE_PROVIDER_SITE_OTHER): Payer: No Typology Code available for payment source | Admitting: Pulmonary Disease

## 2021-07-13 VITALS — BP 124/72 | HR 74 | Temp 98.0°F | Ht 70.0 in | Wt 265.8 lb

## 2021-07-13 DIAGNOSIS — J45909 Unspecified asthma, uncomplicated: Secondary | ICD-10-CM | POA: Diagnosis not present

## 2021-07-13 MED ORDER — AZELASTINE HCL 0.1 % NA SOLN: 2.0000 | Freq: Two times a day (BID) | NASAL | 5 refills | Status: DC

## 2021-07-13 MED ORDER — FLUTICASONE PROPIONATE 50 MCG/ACT NA SUSP: 2.0000 | Freq: Every day | NASAL | 5 refills | Status: DC

## 2021-07-13 MED ORDER — BUDESONIDE-FORMOTEROL FUMARATE 160-4.5 MCG/ACT IN AERO: 2.0000 | INHALATION_SPRAY | Freq: Two times a day (BID) | RESPIRATORY_TRACT | 5 refills | Status: DC

## 2021-07-13 NOTE — Patient Instructions (Signed)
I am glad you are doing well with regard to your breathing Continue the Symbicort inhaler and Flonase, Astelin.  We will renew these medications Follow-up in 1 year.

## 2021-07-13 NOTE — Progress Notes (Signed)
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   Pets: Has an outdoor dog.  No birds, farm animals Occupation: Works in a Surveyor, mining Exposures: No known exposures, no mold, hot tub, Jacuzzi Smoking history: Minimal smoking as a teenager Travel history: No significant travel history Relevant family history: No significant family history of lung disease  Interim history: She is doing well on Symbicort She had a couple of exacerbations over the summer requiring prednisone from clinic in urgent care.  She is back to normal now Hardly needs to use her albuterol rescue inhaler.   Outpatient Encounter Medications as of 07/13/2021  Medication Sig   azelastine (ASTELIN) 0.1 % nasal spray Place 2 sprays into both nostrils 2 (two) times daily.   budesonide-formoterol (SYMBICORT) 160-4.5 MCG/ACT inhaler Inhale 2 puffs into the lungs 2 (two) times daily.   chlorpheniramine (CHLOR-TRIMETON) 4 MG tablet Take 3 tablets (12 mg total) by mouth at bedtime.   fluticasone (FLONASE) 50 MCG/ACT nasal spray Place 2 sprays into both nostrils daily.   omeprazole (PRILOSEC) 20 MG capsule Take 1 capsule (20 mg total) by mouth daily.   ranitidine (ZANTAC) 150 MG tablet Take 150 mg by mouth 2 (two) times daily.   [DISCONTINUED] Azelastine-Fluticasone (DYMISTA) 137-50 MCG/ACT SUSP Place 2 sprays into both nostrils daily.   [DISCONTINUED] predniSONE (DELTASONE) 10 MG tablet Take 4 tabs x 2 days, then 3 x 2d, then 2 x 2d, then 1 x 2d and STOP   [DISCONTINUED] predniSONE (DELTASONE) 10 MG tablet Take 1 tablet (10 mg total)  by mouth daily with breakfast. Prednisone 40 mg a day for 5 days   [DISCONTINUED] predniSONE (DELTASONE) 20 MG tablet Take 2 tablets daily for 5 days   [DISCONTINUED] ZITHROMAX Z-PAK 250 MG tablet Take two tablets today Then take one tablet daily until gone.   No facility-administered encounter medications on file as of 07/13/2021.   Physical Exam: Blood pressure 124/72, pulse 74, temperature 98 F (36.7 C), temperature source Oral, height 5\' 10"  (1.778 m), weight 265 lb 12.8 oz (120.6 kg), SpO2 96 %. Gen:      No acute distress HEENT:  EOMI, sclera anicteric Neck:     No masses; no thyromegaly Lungs:    Clear to auscultation bilaterally; normal respiratory effort CV:         Regular rate and rhythm; no murmurs Abd:      + bowel sounds; soft, non-tender; no palpable masses, no distension Ext:    No edema; adequate peripheral perfusion Skin:      Warm and dry; no rash Neuro: alert and oriented x 3 Psych: normal mood and affect   Data Reviewed: Imaging: CT chest 04/29/2019- postsurgical changes of right upper lobe wedge resection.  No suspicious pulmonary nodule.  Mild subpleural reticulation in the right middle lobe.   CT chest 05/01/2020-postsurgical changes.  No acute process.  Chest x-ray 08/05/2020-no acute cardiopulmonary disease I have reviewed the images personally.  PFTs: 01/20/2016 FVC 2.10 (70%), FEV1 2.58 (82%), F/F 83, TLC 84%, DLCO 86% Normal test   08/24/2016 FVC 3.01 [96%], FEV1 2.47 [79%], F/F  82 No obstruction.  No bronchodilator response.  ACQ7 05/03/2019- 1.71 ACT score 05/06/20- 19  Labs: CBC 6/22- WBC 5.3, eos 1%, absolute eosinophil count 53 RAST panel 06/01/2018-IgE less than 2.  Negative allergies.  Assessment:  Mild asthma Stable on Symbicort inhaler. She knows to use extra puffs of Symbicort as needed for rescue medication but has not needed it so far. Symptoms currently well controlled  Allergic rhinitis, GERD Continue Flonase, antihistamine,  Protonix  Plan/Recommendations: - Continue Symbicort  Follow-up in clinic in 12 months.  Marshell Garfinkel MD [facility] 07/13/2021, 9:26 AM  CC: Windell Hummingbird, PA-C

## 2021-07-13 NOTE — Addendum Note (Signed)
Addended by: Darliss Ridgel on: 07/13/2021 10:22 AM   Modules accepted: Orders

## 2021-08-03 ENCOUNTER — Telehealth: Payer: Self-pay | Admitting: Pulmonary Disease

## 2021-08-03 MED ORDER — PREDNISONE 20 MG PO TABS: 40.0000 mg | ORAL_TABLET | Freq: Every day | ORAL | 0 refills | Status: DC

## 2021-08-03 NOTE — Telephone Encounter (Signed)
I spoke with the pt and notified of response per Dr Vaughan Browner  She verbalized understanding  Rx for pred sent to preferred pharm  She is aware to call back if not improving

## 2021-08-03 NOTE — Telephone Encounter (Signed)
Please call in prednisone 40mg /day for 4 days  We can avoid antibiotics since she have clear mucus and no fevers

## 2021-08-03 NOTE — Telephone Encounter (Signed)
Called and spoke with patient. She stated that she has noticed an increase in her cough over the past 2 days. She described the cough as being productive with thick clear mucus. She denied any fevers, chills or increased SOB. She has noticed some increased chest tightness in the middle of her chest.   She is still using her Symbicort inhaler.   She is UTD on her covid vaccines.   Pharmacy is [facility] in [place].   [name], can you please advise? Thanks!

## 2021-08-03 NOTE — Telephone Encounter (Signed)
Patient is returning phone call. Patient phone number is [phone].

## 2021-08-03 NOTE — Telephone Encounter (Signed)
LMTCB for the pt R

## 2021-09-10 ENCOUNTER — Ambulatory Visit: Payer: No Typology Code available for payment source | Admitting: Pulmonary Disease

## 2021-09-14 ENCOUNTER — Ambulatory Visit (INDEPENDENT_AMBULATORY_CARE_PROVIDER_SITE_OTHER): Payer: No Typology Code available for payment source | Admitting: Pulmonary Disease

## 2021-09-14 ENCOUNTER — Encounter: Payer: Self-pay | Admitting: Pulmonary Disease

## 2021-09-14 ENCOUNTER — Other Ambulatory Visit: Payer: Self-pay

## 2021-09-14 ENCOUNTER — Ambulatory Visit (INDEPENDENT_AMBULATORY_CARE_PROVIDER_SITE_OTHER): Payer: No Typology Code available for payment source

## 2021-09-14 VITALS — BP 120/80 | HR 66 | Temp 98.0°F | Ht 68.0 in | Wt 263.2 lb

## 2021-09-14 DIAGNOSIS — J45909 Unspecified asthma, uncomplicated: Secondary | ICD-10-CM | POA: Diagnosis not present

## 2021-09-14 DIAGNOSIS — K219 Gastro-esophageal reflux disease without esophagitis: Secondary | ICD-10-CM

## 2021-09-14 DIAGNOSIS — R0681 Apnea, not elsewhere classified: Secondary | ICD-10-CM

## 2021-09-14 DIAGNOSIS — R059 Cough, unspecified: Secondary | ICD-10-CM

## 2021-09-14 NOTE — Patient Instructions (Signed)
We will get a chest x-ray today Use the Prilosec at night to help with the cough Raise the head of the bed Do not eat for a few hours before laying down at night Continue the nasal sprays.  You can use over-the-counter antihistamine such as Benadryl for postnasal drip Delsym over-the-counter for cough  Follow-up in 1 year

## 2021-09-14 NOTE — Progress Notes (Signed)
Anna Larson    [id number]    Jun 23, 1966  Primary Care Physician:Taylor, Randolm Idol  Referring Physician: Windell Hummingbird, PA-C [address] 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   Pets: Has an outdoor dog.  No birds, farm animals Occupation: Works in a Surveyor, mining Exposures: No known exposures, no mold, hot tub, Jacuzzi Smoking history: Minimal smoking as a teenager Travel history: No significant travel history Relevant family history: No significant family history of lung disease  Interim history: Continues on Symbicort Complains of mild increase in cough over the past week.  Does not recall any exposures. No increase in dyspnea or wheezing or chest congestion.  Outpatient Encounter Medications as of 09/14/2021  Medication Sig   azelastine (ASTELIN) 0.1 % nasal spray Place 2 sprays into both nostrils 2 (two) times daily.   budesonide-formoterol (SYMBICORT) 160-4.5 MCG/ACT inhaler Inhale 2 puffs into the lungs 2 (two) times daily.   chlorpheniramine (CHLOR-TRIMETON) 4 MG tablet Take 3 tablets (12 mg total) by mouth at bedtime.   fluticasone (FLONASE) 50 MCG/ACT nasal spray Place 2 sprays into both nostrils daily.   omeprazole (PRILOSEC) 20 MG capsule Take 1 capsule (20 mg total) by mouth daily.   ranitidine (ZANTAC) 150 MG tablet Take 150 mg by mouth 2 (two) times daily.   [DISCONTINUED] Azelastine-Fluticasone (DYMISTA) 137-50 MCG/ACT SUSP Place 2 sprays into both nostrils daily.   [DISCONTINUED] predniSONE (DELTASONE) 20 MG tablet Take 2 tablets (40 mg total) by mouth daily with breakfast.   No facility-administered encounter medications on file as of 09/14/2021.   Physical Exam: Blood pressure 120/80, pulse 66,  temperature 98 F (36.7 C), temperature source Oral, height 5\' 8"  (1.727 m), weight 263 lb 3.2 oz (119.4 kg), SpO2 98 %. Gen:      No acute distress HEENT:  EOMI, sclera anicteric Neck:     No masses; no thyromegaly Lungs:    Clear to auscultation bilaterally; normal respiratory effort CV:         Regular rate and rhythm; no murmurs Abd:      + bowel sounds; soft, non-tender; no palpable masses, no distension Ext:    No edema; adequate peripheral perfusion Skin:      Warm and dry; no rash Neuro: alert and oriented x 3 Psych: normal mood and affect   Data Reviewed: Imaging: CT chest 04/29/2019- postsurgical changes of right upper lobe wedge resection.  No suspicious pulmonary nodule.  Mild subpleural reticulation in the right middle lobe.  CT chest 05/01/2020-postsurgical changes.  No acute process. Chest x-ray 08/05/2020-no acute cardiopulmonary disease I have reviewed the images personally.  PFTs: 01/20/2016 FVC 2.10 (70%), FEV1 2.58 (82%), F/F 83, TLC 84%, DLCO 86% Normal test   08/24/2016 FVC 3.01 [96%], FEV1 2.47 [79%], F/F 82 No obstruction.  No bronchodilator response.  ACQ7 05/03/2019- 1.71 ACT score 05/06/20- 19  Labs: CBC 6/22- WBC 5.3, eos 1%, absolute eosinophil count 53 RAST panel 06/01/2018-IgE less than 2.  Negative allergies.  Assessment:  Mild asthma Stable on Symbicort inhaler. She knows to use extra puffs of Symbicort as needed for rescue medication but has not needed it so far. Symptoms currently well controlled  Chronic cough Allergic rhinitis, GERD Continues to have ongoing cough.  We  will get chest x-ray today Continue Flonase, antihistamine Continue PPI and Zantac.  Advised her to take Prilosec at night before bed Raise head of the bed and avoid dinner 2 hours before sleep  Plan/Recommendations: - Continue Symbicort - Chest x-ray - Ongoing treatment for postnasal drip, GERD  Marshell Garfinkel MD [facility] 09/14/2021, 10:00  AM  CC: Windell Hummingbird, PA-C

## 2022-09-14 ENCOUNTER — Ambulatory Visit: Payer: PRIVATE HEALTH INSURANCE | Admitting: Pulmonary Disease

## 2022-10-27 ENCOUNTER — Encounter: Payer: Self-pay | Admitting: Primary Care

## 2022-10-27 ENCOUNTER — Telehealth: Payer: Self-pay | Admitting: Pulmonary Disease

## 2022-10-27 ENCOUNTER — Telehealth (INDEPENDENT_AMBULATORY_CARE_PROVIDER_SITE_OTHER): Payer: PRIVATE HEALTH INSURANCE | Admitting: Primary Care

## 2022-10-27 DIAGNOSIS — J45901 Unspecified asthma with (acute) exacerbation: Secondary | ICD-10-CM | POA: Diagnosis not present

## 2022-10-27 MED ORDER — PREDNISONE 10 MG PO TABS: ORAL_TABLET | ORAL | 0 refills | Status: DC

## 2022-10-27 MED ORDER — BUDESONIDE-FORMOTEROL FUMARATE 160-4.5 MCG/ACT IN AERO: 2.0000 | INHALATION_SPRAY | Freq: Two times a day (BID) | RESPIRATORY_TRACT | 5 refills | Status: AC

## 2022-10-27 MED ORDER — AZELASTINE HCL 0.1 % NA SOLN: 2.0000 | Freq: Two times a day (BID) | NASAL | 5 refills | Status: AC

## 2022-10-27 MED ORDER — FLUTICASONE PROPIONATE 50 MCG/ACT NA SUSP: 2.0000 | Freq: Every day | NASAL | 5 refills | Status: AC

## 2022-10-27 NOTE — Progress Notes (Signed)
Virtual Visit via Video Note  I connected with Anna Larson on 10/27/22 at 10:30 AM EST by a video enabled telemedicine application and verified that I am speaking with the correct person using two identifiers.  Location: Patient: Home Provider: Office    I discussed the limitations of evaluation and management by telemedicine and the availability of in person appointments. The patient expressed understanding and agreed to proceed.  History of Present Illness: 56 year old female, former smoker. PMH COPD, allergic rhinitis, mild intermittent asthma, stage IIA non-small cell lung cancer s/p resection. Patient of [name], last seen on 09/14/21.   10/27/2022 - Interim hx  Patient contacted today for virtual visit. She was last seen in November 2022. She has hx asthma. She developed productive cough with clear mucus 2 days ago. Associated chest tightness. She is not currently using Symbicort, prescription is out of date. She is unsure she will be able to afford inhaler as she does not hve insurance. She also needs refills of nasal sprays. No fever, chest tightness or shortness of breath.    Observations/Objective:  - Appears well; No overt shortness of breath, wheezing or coughing noted   Assessment and Plan:  Asthmatic bronchitis: - Patient developed productive cough 2 days ago with associated chest tightness. No sputum purulence or fever. Holding off on antibiotics right now. Sending in prednisone taper (40mg  x 2 days; 30mg  x 2 days; 20mg  x 2 days; 10mg  x 2 days), refilling Symbicort 193mcg two puffs twice daily and astelin/fluticasone nasal spray. Advised patient take mucinex 600mg  twice daily as needed for chest congestion/cough. Call if symptoms do not improve or worsen.  Follow Up Instructions:   - As needed if symptoms do not improve   I discussed the assessment and treatment plan with the patient. The patient was provided an opportunity to ask questions and all were answered. The  patient agreed with the plan and demonstrated an understanding of the instructions.   The patient was advised to call back or seek an in-person evaluation if the symptoms worsen or if the condition fails to improve as anticipated.  I provided 22 minutes of non-face-to-face time during this encounter.   Martyn Ehrich, NP

## 2022-10-27 NOTE — Telephone Encounter (Signed)
Called and spoke with pt who states she has had a cough for two days now. States the phlegm is currently clear. Denies any complaints of wheezing. No fever. Pt has not taken a covid test yet.  Pt has tried mucinex as well as Robitussin DM.  Stated to pt since she has not been seen in over a year that we needed to schedule her a video visit to further discuss recommendations and she verbalized understanding. Scheduled pt a video visit with Geraldo Pitter at 10:30. Nothing further needed.

## 2022-10-27 NOTE — Telephone Encounter (Signed)
PT sick ans would like to speak with a nurse. No fever but bad cough mostly in eve it starts. Phlegm is minimal and clear. Neck aches Pls call to advise. [phone]

## 2022-11-14 ENCOUNTER — Ambulatory Visit: Payer: PRIVATE HEALTH INSURANCE | Admitting: Pulmonary Disease

## 2022-12-01 ENCOUNTER — Ambulatory Visit: Payer: PRIVATE HEALTH INSURANCE | Admitting: Pulmonary Disease

## 2022-12-14 ENCOUNTER — Telehealth (INDEPENDENT_AMBULATORY_CARE_PROVIDER_SITE_OTHER): Payer: PRIVATE HEALTH INSURANCE | Admitting: Nurse Practitioner

## 2022-12-14 ENCOUNTER — Encounter: Payer: Self-pay | Admitting: Nurse Practitioner

## 2022-12-14 VITALS — Ht 68.0 in | Wt 360.0 lb

## 2022-12-14 DIAGNOSIS — J4531 Mild persistent asthma with (acute) exacerbation: Secondary | ICD-10-CM | POA: Diagnosis not present

## 2022-12-14 DIAGNOSIS — J45901 Unspecified asthma with (acute) exacerbation: Secondary | ICD-10-CM

## 2022-12-14 DIAGNOSIS — J0191 Acute recurrent sinusitis, unspecified: Secondary | ICD-10-CM

## 2022-12-14 MED ORDER — PREDNISONE 10 MG PO TABS: ORAL_TABLET | ORAL | 0 refills | Status: DC

## 2022-12-14 MED ORDER — BENZONATATE 200 MG PO CAPS: 200.0000 mg | ORAL_CAPSULE | Freq: Three times a day (TID) | ORAL | 1 refills | Status: AC | PRN

## 2022-12-14 MED ORDER — ALBUTEROL SULFATE (2.5 MG/3ML) 0.083% IN NEBU: 2.5000 mg | INHALATION_SOLUTION | Freq: Four times a day (QID) | RESPIRATORY_TRACT | 5 refills | Status: AC | PRN

## 2022-12-14 NOTE — Progress Notes (Signed)
Patient ID: Anna Larson, female     DOB: Mar 17, 1966, 57 y.o.      MRN: [id number]  Chief Complaint  Patient presents with   Follow-up    Pt acute UC visit last week w/ chest congestion prednisone for 12 days and Augmentin (for 14 days. She feels like it isn't getting better and has now developed a cough/sore throat, and increased SOB but denies fever  Does not use O2 @ home    Virtual Visit via Video Note  I connected with Anna Larson on 12/16/22 at 10:00 AM EST by a video enabled telemedicine application and verified that I am speaking with the correct person using two identifiers.  Location: Patient: Home Provider: Office   I discussed the limitations of evaluation and management by telemedicine and the availability of in person appointments. The patient expressed understanding and agreed to proceed.  History of Present Illness: 57 year old female, former smoker followed for asthma. She has a history of stage IIA non-small cell lung cancer s/p resection. She is a patient of Dr. Matilde Bash and last seen via virtual visit 10/27/2022 by [name] NP. Past medical history significant for allergic rhinitis, GERD, obesity, IDA.  TESTS/EVENTS: 08/24/2016 PFT: FVC 77, FEV1 78, ratio 82. No BD 05/01/2020 CT chest w contrast: status post RUL wedge resection without recurrent or metastatic disease. No acute process  10/27/2022: Virtual visit with [name] NP. Last seen November 2022. She developed productive cough with clear mucus x 2 days. Associated chest tightness. Not currently using her Symbicort, prescription out of date. She was treated with prednisone taper. Holding off on abx given lack of infectious symptoms.   12/14/2022: Today - acute Patient presents today for acute visit. She was seen 12/21 and treated for asthma exacerbation with steroid taper. She felt some better after this but then her symptoms returned a few weeks ago. She went to urgent care on 1/27 and was treated with augmentin  10 day course, which she is still on, and a prednisone taper, which she is also on but she states that it is a different taper where she takes multiple doses a day and she doesn't feel like it's working as well. She does tell me she feels somewhat better. Cough is non-productive but still persistent. She's having some sore throat from coughing frequently. She has nasal congestion and some drainage. She continues to have increased shortness of breath from her baseline; felt like this responded better to the previous taper she was prescribed last time she was seen here. She denies fevers, chills, hemoptysis, interim sick exposures, orthopnea, leg swelling. She is using her symbicort twice daily. She uses flonase and astelin nasal sprays daily.   Allergies  Allergen Reactions   Codeine Rash   Immunization History  Administered Date(s) Administered   Influenza Inj Mdck Quad Pf 10/15/2018   Influenza Split 09/07/2012   Influenza Whole 08/08/2022   Influenza, High Dose Seasonal PF 07/11/2013   Influenza,inj,Quad PF,6+ Mos 07/29/2014, 07/30/2015, 08/24/2016, 09/05/2021   PFIZER(Purple Top)SARS-COV-2 Vaccination 01/03/2020, 01/25/2020, 09/25/2020   Pneumococcal Conjugate-13 10/20/2015   Pneumococcal Polysaccharide-23 09/23/2013   Past Medical History:  Diagnosis Date   Allergic rhinitis    Anemia    Takes Ferrous sulfate   Arthritis    Asthma    COPD (chronic obstructive pulmonary disease) (HCC)    Cough    GERD (gastroesophageal reflux disease)    History of kidney stones    x 3 episodes   Hx of  adenomatous + sessile serrated colonic polyps 09/22/2015   Lung nodule < 6cm on CT    Pneumonia    hx of   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     azelastine (ASTELIN) 0.1 % nasal spray Place 2 sprays into both nostrils 2 (two) times daily. 30 mL 5   budesonide-formoterol (SYMBICORT) 160-4.5 MCG/ACT inhaler Inhale 2 puffs into the lungs 2 (two) times daily. 1 each 5   chlorpheniramine (CHLOR-TRIMETON) 4 MG tablet Take 3 tablets (12 mg total) by mouth at bedtime. 90 tablet 2   fluticasone (FLONASE) 50 MCG/ACT nasal spray Place 2 sprays into both nostrils daily. 16 g 5   omeprazole (PRILOSEC) 20 MG capsule Take 1 capsule (20 mg total) by mouth daily. 30 capsule 0   ranitidine (ZANTAC) 150 MG tablet Take 150 mg by mouth 2 (two) times daily.     predniSONE (DELTASONE) 10 MG tablet 4 tabs for 2 days, then 3 tabs for 2 days, 2 tabs for 2 days, then 1 tab for 2 days, then stop 20 tablet 0   azithromycin (ZITHROMAX) 250 MG tablet Take 250 mg by mouth once. (Patient not taking: Reported on 12/14/2022)     No facility-administered medications prior to visit.     Review of Systems:   Constitutional: No weight loss or gain, night sweats, fevers, chills, or lassitude. +fatigue HEENT: No headaches, difficulty swallowing, tooth/dental problems. No sneezing, itching, ear ache. +nasal congestion, post nasal drip, sore throat  CV:  No chest pain, orthopnea, PND, swelling in lower extremities, anasarca, dizziness, palpitations, syncope Resp: +shortness of breath with exertion; non-productive cough. No excess mucus or change in color of mucus. No productive or non-productive. No hemoptysis. No wheezing.  No chest wall deformity GI:  No heartburn, indigestion, abdominal pain, nausea, vomiting, diarrhea, change in bowel habits, loss of appetite, bloody stools.  GU: No  dysuria, change in color of urine, urgency or frequency.   Skin: No rash, lesions, ulcerations MSK:  No joint pain or swelling.   Neuro: No dizziness or lightheadedness.  Psych: No depression or anxiety. Mood stable.   Observations/Objective: Patient is well-developed, well-nourished in no acute distress. A&Ox3. Resting comfortably at home. Unlabored breathing. Speech is clear and coherent with logical content.   Assessment and Plan: Mild asthma with exacerbation Unresolved asthma exacerbation. We will adjust her current prednisone taper and focus on cough control measures. She will use nebulizer treatments to optimize bronchodilator therapy. I do think a component of her cough is related to postnasal drainage, which we will add saline rinses and short term use of nasal decongestant for. She was advised she will need an in person visit with imaging if symptoms do not improve. Encouraged to monitor O2 levels at home. Provided with ED precautions. Asthma action plan in place.  Patient Instructions  Continue Symbicort 2 puffs Twice daily. Brush tongue and rinse mouth afterwards Continue Albuterol inhaler 2 puffs every 6 hours as  needed for shortness of breath or wheezing. I am sending in orders for you to have a nebulizer as well. Use this four times a day until symptoms are better. Continue flonase nasal spray 2 sprays each nostril daily Continue astelin nasal spray 2 sprays each nostril twice daily  -Complete augmentin course -We will adjust your prednisone taper. 4 tabs for 3 days, then 3 tabs for 3 days, 2 tabs for 3 days, then 1 tab for 3 days, then stop. Take in AM with food -Mucinex DM Twice daily for cough/congestion. Use consistently over the next 3-4 days or until symptoms get better.  -Benzonatate 1 capsule Three times a day for cough. Use consistently over the next 3-4 days or until symptoms get better -Saline nasal rinse (netti pot, Milta Deiters Med or like device) 1-2 times a day. Use as  directed with distilled water. Do not use tap water. Follow with flonase and astelin 20-30 minutes later  -You can also use an over the counter nasal decongestant like Afrin, but do not use longer than 3 days.  Asthma action plan: If your symptoms rapidly worsen, you have trouble talking or extreme difficulty breathing, or you're not getting relief from your nebulizer/rescue, go to the emergency department.  If you do not improve, you will need to be seen in office  Follow up in 7-10 days with [name] or Alanson Aly. If symptoms do not improve or worsen, please contact office for sooner follow up or seek emergency care.    Acute sinusitis See above    I discussed the assessment and treatment plan with the patient. The patient was provided an opportunity to ask questions and all were answered. The patient agreed with the plan and demonstrated an understanding of the instructions.   The patient was advised to call back or seek an in-person evaluation if the symptoms worsen or if the condition fails to improve as anticipated.  I provided 35 minutes of non-face-to-face time during this encounter.   Clayton Bibles, NP

## 2022-12-14 NOTE — Patient Instructions (Addendum)
Continue Symbicort 2 puffs Twice daily. Brush tongue and rinse mouth afterwards Continue Albuterol inhaler 2 puffs every 6 hours as needed for shortness of breath or wheezing. I am sending in orders for you to have a nebulizer as well. Use this four times a day until symptoms are better. Continue flonase nasal spray 2 sprays each nostril daily Continue astelin nasal spray 2 sprays each nostril twice daily  -Complete augmentin course -We will adjust your prednisone taper. 4 tabs for 3 days, then 3 tabs for 3 days, 2 tabs for 3 days, then 1 tab for 3 days, then stop. Take in AM with food -Mucinex DM Twice daily for cough/congestion. Use consistently over the next 3-4 days or until symptoms get better.  -Benzonatate 1 capsule Three times a day for cough. Use consistently over the next 3-4 days or until symptoms get better -Saline nasal rinse (netti pot, Milta Deiters Med or like device) 1-2 times a day. Use as directed with distilled water. Do not use tap water. Follow with flonase and astelin 20-30 minutes later  -You can also use an over the counter nasal decongestant like Afrin, but do not use longer than 3 days.  Asthma action plan: If your symptoms rapidly worsen, you have trouble talking or extreme difficulty breathing, or you're not getting relief from your nebulizer/rescue, go to the emergency department.  If you do not improve, you will need to be seen in office  Follow up in 7-10 days with [name] or Alanson Aly. If symptoms do not improve or worsen, please contact office for sooner follow up or seek emergency care.

## 2022-12-15 NOTE — Assessment & Plan Note (Addendum)
Unresolved asthma exacerbation. We will adjust her current prednisone taper and focus on cough control measures. She will use nebulizer treatments to optimize bronchodilator therapy. I do think a component of her cough is related to postnasal drainage, which we will add saline rinses and short term use of nasal decongestant for. She was advised she will need an in person visit with imaging if symptoms do not improve. Encouraged to monitor O2 levels at home. Provided with ED precautions. Asthma action plan in place.  Patient Instructions  Continue Symbicort 2 puffs Twice daily. Brush tongue and rinse mouth afterwards Continue Albuterol inhaler 2 puffs every 6 hours as needed for shortness of breath or wheezing. I am sending in orders for you to have a nebulizer as well. Use this four times a day until symptoms are better. Continue flonase nasal spray 2 sprays each nostril daily Continue astelin nasal spray 2 sprays each nostril twice daily  -Complete augmentin course -We will adjust your prednisone taper. 4 tabs for 3 days, then 3 tabs for 3 days, 2 tabs for 3 days, then 1 tab for 3 days, then stop. Take in AM with food -Mucinex DM Twice daily for cough/congestion. Use consistently over the next 3-4 days or until symptoms get better.  -Benzonatate 1 capsule Three times a day for cough. Use consistently over the next 3-4 days or until symptoms get better -Saline nasal rinse (netti pot, Milta Deiters Med or like device) 1-2 times a day. Use as directed with distilled water. Do not use tap water. Follow with flonase and astelin 20-30 minutes later  -You can also use an over the counter nasal decongestant like Afrin, but do not use longer than 3 days.  Asthma action plan: If your symptoms rapidly worsen, you have trouble talking or extreme difficulty breathing, or you're not getting relief from your nebulizer/rescue, go to the emergency department.  If you do not improve, you will need to be seen in  office  Follow up in 7-10 days with [name] or Alanson Aly. If symptoms do not improve or worsen, please contact office for sooner follow up or seek emergency care.

## 2022-12-16 ENCOUNTER — Encounter: Payer: Self-pay | Admitting: Nurse Practitioner

## 2022-12-16 NOTE — Assessment & Plan Note (Signed)
See above

## 2022-12-26 ENCOUNTER — Ambulatory Visit: Payer: PRIVATE HEALTH INSURANCE | Admitting: Pulmonary Disease

## 2023-01-23 ENCOUNTER — Telehealth: Payer: Self-pay | Admitting: Pulmonary Disease

## 2023-01-23 NOTE — Telephone Encounter (Signed)
PT asked for cost w/o ins. Gave cost. She set appt out 2 mo.

## 2023-01-23 NOTE — Telephone Encounter (Signed)
Closing encouner

## 2023-01-24 ENCOUNTER — Ambulatory Visit: Payer: PRIVATE HEALTH INSURANCE | Admitting: Pulmonary Disease

## 2023-03-31 ENCOUNTER — Ambulatory Visit: Payer: PRIVATE HEALTH INSURANCE | Admitting: Pulmonary Disease

## 2023-04-04 ENCOUNTER — Ambulatory Visit (INDEPENDENT_AMBULATORY_CARE_PROVIDER_SITE_OTHER): Payer: Self-pay | Admitting: Pulmonary Disease

## 2023-04-04 ENCOUNTER — Encounter: Payer: Self-pay | Admitting: Pulmonary Disease

## 2023-04-04 VITALS — BP 124/72 | HR 78 | Temp 97.9°F | Ht 68.0 in | Wt 274.6 lb

## 2023-04-04 DIAGNOSIS — J4531 Mild persistent asthma with (acute) exacerbation: Secondary | ICD-10-CM

## 2023-04-04 MED ORDER — BREZTRI AEROSPHERE 160-9-4.8 MCG/ACT IN AERO: 2.0000 | INHALATION_SPRAY | Freq: Two times a day (BID) | RESPIRATORY_TRACT | 0 refills | Status: AC

## 2023-04-04 MED ORDER — BREZTRI AEROSPHERE 160-9-4.8 MCG/ACT IN AERO: 2.0000 | INHALATION_SPRAY | Freq: Two times a day (BID) | RESPIRATORY_TRACT | 5 refills | Status: AC

## 2023-04-04 NOTE — Patient Instructions (Signed)
Will give you samples of inhalers called Breztri to help with your breathing Continue to use the Flonase and chlorpheniramine He can start taking the chlorphentermine 1 tablet 3 times a day. Follow-up in 6 months

## 2023-04-04 NOTE — Progress Notes (Addendum)
Anna Larson    [id number]    1965-11-26  Primary Care Physician:Taylor, Lorenso Quarry  Referring Physician: Darryl Lent, PA-C [address] [place],  [place] [address]  Chief complaint: Follow-up for asthma  HPI: 57 y.o. 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   Pets: Has an outdoor dog.  No birds, farm animals Occupation: Works in a Librarian, academic Exposures: No known exposures, no mold, hot tub, Jacuzzi Smoking history: Minimal smoking as a teenager Travel history: No significant travel history Relevant family history: No significant family history of lung disease  Interim history: She was seen by Buelah Manis nurse practitioner in December 2023 for mild flare which was treated with prednisone taper.  No need for antibiotics. Currently on [facility]  Outpatient Encounter Medications as of 04/04/2023  Medication Sig   albuterol (PROVENTIL) (2.5 MG/3ML) 0.083% nebulizer solution Take 3 mLs (2.5 mg total) by nebulization every 6 (six) hours as needed for wheezing or shortness of breath.   azelastine (ASTELIN) 0.1 % nasal spray Place 2 sprays into both nostrils 2 (two) times daily.   budesonide-formoterol (SYMBICORT) 160-4.5 MCG/ACT inhaler Inhale 2 puffs into the lungs 2 (two) times daily.   chlorpheniramine (CHLOR-TRIMETON) 4 MG tablet Take 3 tablets (12 mg total) by mouth at bedtime.   fluticasone (FLONASE) 50 MCG/ACT nasal spray Place 2 sprays into both nostrils daily.   omeprazole (PRILOSEC) 20 MG capsule Take 1 capsule (20 mg total) by mouth daily.   ranitidine (ZANTAC) 150 MG tablet Take 150 mg by mouth 2 (two) times daily.   benzonatate (TESSALON) 200 MG capsule Take 1 capsule (200 mg total) by mouth 3 (three) times daily as needed for cough. (Patient not taking: Reported on 04/04/2023)   [DISCONTINUED]  amoxicillin-clavulanate (AUGMENTIN) 875-125 MG tablet Take 1 tablet by mouth 2 (two) times daily.   [DISCONTINUED] Azelastine-Fluticasone (DYMISTA) 137-50 MCG/ACT SUSP Place 2 sprays into both nostrils daily.   [DISCONTINUED] azithromycin (ZITHROMAX) 250 MG tablet Take 250 mg by mouth once. (Patient not taking: Reported on 12/14/2022)   [DISCONTINUED] predniSONE (DELTASONE) 10 MG tablet 4 tabs for 3 days, then 3 tabs for 3 days, 2 tabs for 3 days, then 1 tab for 3 days, then stop   No facility-administered encounter medications on file as of 04/04/2023.   Physical Exam: Blood pressure 120/80, pulse 66, temperature 98 F (36.7 C), temperature source Oral, height 5\' 8"  (1.727 m), Blood pressure 124/72, pulse 78, temperature 97.9 F (36.6 C), temperature source Oral, height 5\' 8"  (1.727 m), weight 274 lb 9.6 oz (124.6 kg), SpO2 100%. Gen:      No acute distress HEENT:  EOMI, sclera anicteric Neck:     No masses; no thyromegaly Lungs:    Clear to auscultation bilaterally; normal respiratory effort CV:         Regular rate and rhythm; no murmurs Abd:      + bowel sounds; soft, non-tender; no palpable masses, no distension Ext:    No edema; adequate peripheral perfusion Skin:      Warm and dry; no rash Neuro: alert and oriented x 3 Psych: normal mood and affect   Data Reviewed: Imaging: CT chest 04/29/2019- postsurgical changes of right upper lobe wedge resection.  No suspicious pulmonary nodule.  Mild subpleural reticulation in the right middle lobe.  CT chest 05/01/2020-postsurgical  changes.  No acute process. Chest x-ray 08/05/2020-no acute cardiopulmonary disease I have reviewed the images personally.  PFTs: 01/20/2016 FVC 2.10 (70%), FEV1 2.58 (82%), F/F 83, TLC 84%, DLCO 86% Normal test   08/24/2016 FVC 3.01 [96%], FEV1 2.47 [79%], F/F 82 No obstruction.  No bronchodilator response.  ACQ7 05/03/2019- 1.71 ACT score 05/06/20- 19  Labs: CBC 6/22- WBC 5.3, eos 1%, absolute eosinophil  count 53 RAST panel 06/01/2018-IgE less than 2.  Negative allergies.  Assessment:  Mild asthma Continue Breztri  Chronic cough Allergic rhinitis, GERD Continues to have ongoing cough.  We will get chest x-ray today Continue Flonase, antihistamine.  Advised over-the-counter chlorphentermine 1 tablet 3 times daily Continue PPI and Zantac.  Advised her to take Prilosec at night before bed Raise head of the bed and avoid dinner 2 hours before sleep  Plan/Recommendations: - Continue Breztri - Ongoing treatment for postnasal drip, GERD  Chilton Greathouse MD  Pulmonary and Critical Care 04/04/2023, 1:37 PM  CC: Darryl Lent, PA-C

## 2023-10-17 ENCOUNTER — Ambulatory Visit: Payer: Self-pay | Admitting: Pulmonary Disease

## 2023-12-11 ENCOUNTER — Encounter: Payer: Self-pay | Admitting: Pulmonary Disease

## 2023-12-11 ENCOUNTER — Ambulatory Visit (INDEPENDENT_AMBULATORY_CARE_PROVIDER_SITE_OTHER): Payer: No Typology Code available for payment source | Admitting: Pulmonary Disease

## 2023-12-11 VITALS — BP 120/84 | HR 85 | Ht 69.0 in | Wt 264.6 lb

## 2023-12-11 DIAGNOSIS — J4531 Mild persistent asthma with (acute) exacerbation: Secondary | ICD-10-CM | POA: Diagnosis not present

## 2023-12-11 DIAGNOSIS — J4540 Moderate persistent asthma, uncomplicated: Secondary | ICD-10-CM

## 2023-12-11 NOTE — Patient Instructions (Signed)
Glad you are doing well with your breathing Continue the Breztri inhaler Will order PFTs in 6 months and follow-up in clinic in 6 months

## 2023-12-11 NOTE — Progress Notes (Signed)
Anna Larson    [id number]    06/30/1966  Primary Care Physician:Taylor, Lorenso Quarry  Referring Physician: Darryl Lent, PA-C [address] [place],  [place] [address]  Chief complaint: Follow-up for asthma  HPI: 58 y.o. 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   Pets: Has an outdoor dog.  No birds, farm animals Occupation: Works in a Librarian, academic Exposures: No known exposures, no mold, hot tub, Jacuzzi Smoking history: Minimal smoking as a teenager Travel history: No significant travel history Relevant family history: No significant family history of lung disease  Interim history: Continues on Breztri inhaler with no issues No new complaints.  States her breathing is doing well  Patient reports that she had spirometry done at her primary care office in [facility] and was told it was abnormal though she does not have the report or any further details on those.  Outpatient Encounter Medications as of 12/11/2023  Medication Sig   azelastine (ASTELIN) 0.1 % nasal spray Place 2 sprays into both nostrils 2 (two) times daily.   Budeson-Glycopyrrol-Formoterol (BREZTRI AEROSPHERE) 160-9-4.8 MCG/ACT AERO Inhale 2 puffs into the lungs in the morning and at bedtime.   Budeson-Glycopyrrol-Formoterol (BREZTRI AEROSPHERE) 160-9-4.8 MCG/ACT AERO Inhale 2 puffs into the lungs in the morning and at bedtime.   chlorpheniramine (CHLOR-TRIMETON) 4 MG tablet Take 3 tablets (12 mg total) by mouth at bedtime.   fluticasone (FLONASE) 50 MCG/ACT nasal spray Place 2 sprays into both nostrils daily.   omeprazole (PRILOSEC) 20 MG capsule Take 1 capsule (20 mg total) by mouth daily.   phentermine (ADIPEX-P) 37.5 MG tablet Take 37.5 mg by mouth daily.   ranitidine (ZANTAC) 150 MG tablet Take 150 mg by mouth 2 (two) times  daily.   topiramate (TOPAMAX) 25 MG tablet Take 25 mg by mouth at bedtime.   Vitamin D, Ergocalciferol, (DRISDOL) 1.25 MG (50000 UNIT) CAPS capsule Take 50,000 Units by mouth once a week.   albuterol (PROVENTIL) (2.5 MG/3ML) 0.083% nebulizer solution Take 3 mLs (2.5 mg total) by nebulization every 6 (six) hours as needed for wheezing or shortness of breath. (Patient not taking: Reported on 12/11/2023)   benzonatate (TESSALON) 200 MG capsule Take 1 capsule (200 mg total) by mouth 3 (three) times daily as needed for cough. (Patient not taking: Reported on 12/11/2023)   budesonide-formoterol (SYMBICORT) 160-4.5 MCG/ACT inhaler Inhale 2 puffs into the lungs 2 (two) times daily. (Patient not taking: Reported on 12/11/2023)   [DISCONTINUED] Azelastine-Fluticasone (DYMISTA) 137-50 MCG/ACT SUSP Place 2 sprays into both nostrils daily.   No facility-administered encounter medications on file as of 12/11/2023.   Physical Exam: Blood pressure 120/84, pulse 85, height 5\' 9"  (1.753 m), weight 264 lb 9.6 oz (120 kg), SpO2 97%. Gen:      No acute distress HEENT:  EOMI, sclera anicteric Neck:     No masses; no thyromegaly Lungs:    Clear to auscultation bilaterally; normal respiratory effort CV:         Regular rate and rhythm; no murmurs Abd:      + bowel sounds; soft, non-tender; no palpable masses, no distension Ext:    No edema; adequate peripheral perfusion Skin:      Warm and dry; no rash Neuro: alert and oriented x 3 Psych: normal mood and affect   Data Reviewed: Imaging:  CT chest 04/29/2019- postsurgical changes of right upper lobe wedge resection.  No suspicious pulmonary nodule.  Mild subpleural reticulation in the right middle lobe.  CT chest 05/01/2020-postsurgical changes.  No acute process. Chest x-ray 08/05/2020-no acute cardiopulmonary disease Chest x-ray 09/14/2021-no active cardiopulmonary disease I have reviewed the images personally.  PFTs: 01/20/2016 FVC 2.10 (70%), FEV1 2.58 (82%), F/F 83,  TLC 84%, DLCO 86% Normal test   08/24/2016 FVC 3.01 [96%], FEV1 2.47 [79%], F/F 82 No obstruction.  No bronchodilator response.  ACQ7 05/03/2019- 1.71 ACT score 05/06/20- 19  Labs: CBC 6/22- WBC 5.3, eos 1%, absolute eosinophil count 53 RAST panel 06/01/2018-IgE less than 2.  Negative allergies.  Assessment:  Mild asthma Continue Breztri Order PFTs and follow-up in 6 months  Chronic cough Allergic rhinitis, GERD Continue Flonase.  Not taking any antihistamines as cough is better She stopped taking antiacid medication  Plan/Recommendations: - Continue Breztri - PFTs and follow-up  Chilton Greathouse MD [facility] 12/11/2023, 1:27 PM  CC: Darryl Lent, PA-C

## 2024-11-27 ENCOUNTER — Encounter

## 2024-11-27 ENCOUNTER — Ambulatory Visit: Admitting: Pulmonary Disease

## 2024-11-28 ENCOUNTER — Encounter

## 2024-12-04 ENCOUNTER — Ambulatory Visit: Admitting: Primary Care

## 2025-01-14 ENCOUNTER — Ambulatory Visit: Admitting: Primary Care

## 2025-01-14 ENCOUNTER — Encounter
